# Patient Record
Sex: Female | Born: 1990 | Race: White | Hispanic: No | Marital: Single | Smoking: Never smoker
Health system: Southern US, Community
[De-identification: ages and names within clinical notes are randomized; demographics above are authoritative.]

## PROBLEM LIST (undated history)

## (undated) DIAGNOSIS — E079 Disorder of thyroid, unspecified: Secondary | ICD-10-CM

## (undated) DIAGNOSIS — I10 Essential (primary) hypertension: Secondary | ICD-10-CM

## (undated) DIAGNOSIS — E781 Pure hyperglyceridemia: Secondary | ICD-10-CM

## (undated) HISTORY — DX: Disorder of thyroid, unspecified: E07.9

## (undated) HISTORY — DX: Essential (primary) hypertension: I10

---
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
## 2016-07-29 DIAGNOSIS — L509 Urticaria, unspecified: Secondary | ICD-10-CM | POA: Diagnosis not present

## 2016-07-29 DIAGNOSIS — D229 Melanocytic nevi, unspecified: Secondary | ICD-10-CM | POA: Diagnosis not present

## 2016-11-13 DIAGNOSIS — R300 Dysuria: Secondary | ICD-10-CM | POA: Diagnosis not present

## 2016-11-13 DIAGNOSIS — N390 Urinary tract infection, site not specified: Secondary | ICD-10-CM | POA: Diagnosis not present

## 2016-11-22 DIAGNOSIS — T7840XA Allergy, unspecified, initial encounter: Secondary | ICD-10-CM | POA: Diagnosis not present

## 2016-11-22 DIAGNOSIS — R21 Rash and other nonspecific skin eruption: Secondary | ICD-10-CM | POA: Diagnosis not present

## 2017-10-01 ENCOUNTER — Encounter: Payer: Self-pay | Admitting: Obstetrics and Gynecology

## 2017-10-01 ENCOUNTER — Ambulatory Visit: Payer: BLUE CROSS/BLUE SHIELD | Admitting: Obstetrics and Gynecology

## 2017-10-01 ENCOUNTER — Other Ambulatory Visit: Payer: Self-pay

## 2017-10-01 ENCOUNTER — Other Ambulatory Visit (HOSPITAL_COMMUNITY)
Admission: RE | Admit: 2017-10-01 | Discharge: 2017-10-01 | Disposition: A | Discharge status: Home / Self Care | Payer: BLUE CROSS/BLUE SHIELD | Source: Ambulatory Visit | Attending: Obstetrics and Gynecology | Admitting: Obstetrics and Gynecology

## 2017-10-01 VITALS — BP 130/84 | HR 88 | Resp 20 | Ht 63.0 in | Wt 177.2 lb

## 2017-10-01 DIAGNOSIS — Z01419 Encounter for gynecological examination (general) (routine) without abnormal findings: Secondary | ICD-10-CM | POA: Diagnosis not present

## 2017-10-01 NOTE — Addendum Note (Signed)
Addended by: Ardell IsaacsAMUNDSON C SILVA, Orvan Papadakis E on: 10/01/2017 11:14 AM   Modules accepted: Level of Service

## 2017-10-01 NOTE — Progress Notes (Signed)
27 y.o. G0P0000 Single Caucasian female here for annual exam.    Menses not a problem for patient.   Not ever sexually active.   Mother had breast cancer in her 6340s.  Patient uncertain if she had genetic testing.   PCP:   None  Patient's last menstrual period was 09/06/2017 (exact date).     Period Cycle (Days): 30 Period Duration (Days): 6 days Period Pattern: Regular Menstrual Flow: Moderate Menstrual Control: Maxi pad Menstrual Control Change Freq (Hours): every 3-4 hours on heaviest day Dysmenorrhea: (!) Mild Dysmenorrhea Symptoms: Cramping     Sexually active: No.  The current method of family planning is abstinence.    Exercising: Yes.    walking. Smoker:  no  Health Maintenance: Pap:  NEVER History of abnormal Pap:  N/A MMG:  n/a Colonoscopy:  n/a BMD:   n/a  Result  n/a TDaP:  07/2011 Gardasil:   Yes, completed x3.   HIV:no Hep C:  no Screening Labs:   ---   reports that she has never smoked. She has never used smokeless tobacco. She reports that she drank alcohol. She reports that she does not use drugs.  History reviewed. No pertinent past medical history.  History reviewed. No pertinent surgical history.  No current outpatient medications on file.   No current facility-administered medications for this visit.     Family History  Problem Relation Age of Onset  . Breast cancer Mother [id number]       A & W  . Hypertension Father   . Hyperlipidemia Father   . Anxiety disorder Brother   . Diabetes Paternal Grandmother   . Hypertension Paternal Grandmother   . Stroke Paternal Grandmother   . Hypertension Paternal Grandfather     Review of Systems  Constitutional: Negative.   HENT: Negative.   Eyes: Negative.   Respiratory: Negative.   Cardiovascular: Negative.   Gastrointestinal: Negative.   Endocrine: Negative.   Genitourinary: Negative.   Musculoskeletal: Negative.   Skin: Negative.   Allergic/Immunologic: Negative.   Neurological: Negative.    Hematological: Negative.   Psychiatric/Behavioral: Negative.     Exam:   BP 130/84 (BP Location: Right Arm, Patient Position: Sitting, Cuff Size: Normal)   Pulse 88   Resp 20   Ht 5\' 3"  (1.6 m)   Wt 177 lb 3.2 oz (80.4 kg)   LMP 09/06/2017 (Exact Date)   BMI 31.39 kg/m     General appearance: alert, cooperative and appears stated age Head: Normocephalic, without obvious abnormality, atraumatic Neck: no adenopathy, supple, symmetrical, trachea midline and thyroid normal to inspection and palpation Lungs: clear to auscultation bilaterally Breasts: normal appearance, no masses or tenderness, No nipple retraction or dimpling, No nipple discharge or bleeding, No axillary or supraclavicular adenopathy Heart: regular rate and rhythm Abdomen: soft, non-tender; no masses, no organomegaly Extremities: extremities normal, atraumatic, no cyanosis or edema Skin: Skin color, texture, turgor normal. No rashes or lesions Lymph nodes: Cervical, supraclavicular, and axillary nodes normal. No abnormal inguinal nodes palpated Neurologic: Grossly normal  Pelvic: External genitalia:  no lesions              Urethra:  normal appearing urethra with no masses, tenderness or lesions              Bartholins and Skenes: normal                 Vagina: normal appearing vagina with normal color and discharge, no lesions  Cervix: no lesions              Pap taken: Yes.   Bimanual Exam:  Uterus:  normal size, contour, position, consistency, mobility, non-tender              Adnexa: no mass, fullness, tenderness       Chaperone was present for exam.  Assessment:   Well woman visit with normal exam. FH breast cancer.  Premenopausal.    Plan: Mammogram screening 10 years earlier than mother diagnosed. Recommended self breast awareness. Pap and HR HPV as above. Guidelines for Calcium, Vitamin D, regular exercise program including cardiovascular and weight bearing exercise. Routine labs.   Follow up annually and prn.    After visit summary provided.

## 2017-10-01 NOTE — Patient Instructions (Signed)

## 2017-10-02 LAB — COMPREHENSIVE METABOLIC PANEL
ALT: 14 IU/L (ref 0–32)
AST: 20 IU/L (ref 0–40)
Albumin/Globulin Ratio: 2 (ref 1.2–2.2)
Albumin: 4.9 g/dL (ref 3.5–5.5)
Alkaline Phosphatase: 60 IU/L (ref 39–117)
BUN/Creatinine Ratio: 19 (ref 9–23)
BUN: 15 mg/dL (ref 6–20)
Bilirubin Total: <0.2 mg/dL (ref 0.0–1.2)
CO2: 21 mmol/L (ref 20–29)
Calcium: 9.6 mg/dL (ref 8.7–10.2)
Chloride: 104 mmol/L (ref 96–106)
Creatinine, Ser: 0.79 mg/dL (ref 0.57–1.00)
GFR calc Af Amer: 121 mL/min/{1.73_m2} (ref >59)
GFR calc non Af Amer: 105 mL/min/{1.73_m2} (ref >59)
Globulin, Total: 2.5 g/dL (ref 1.5–4.5)
Glucose: 101 mg/dL — ABNORMAL HIGH (ref 65–99)
Potassium: 3.8 mmol/L (ref 3.5–5.2)
Sodium: 141 mmol/L (ref 134–144)
Total Protein: 7.4 g/dL (ref 6.0–8.5)

## 2017-10-02 LAB — CBC
Hematocrit: 40.4 % (ref 34.0–46.6)
Hemoglobin: 14 g/dL (ref 11.1–15.9)
MCH: 31.9 pg (ref 26.6–33.0)
MCHC: 34.7 g/dL (ref 31.5–35.7)
MCV: 92 fL (ref 79–97)
Platelets: 208 10*3/uL (ref 150–450)
RBC: 4.39 x10E6/uL (ref 3.77–5.28)
RDW: 13 % (ref 12.3–15.4)
WBC: 5.8 10*3/uL (ref 3.4–10.8)

## 2017-10-02 LAB — CYTOLOGY - PAP: Diagnosis: NEGATIVE

## 2017-10-02 LAB — LIPID PANEL
Chol/HDL Ratio: 3.2 ratio (ref 0.0–4.4)
Cholesterol, Total: 139 mg/dL (ref 100–199)
HDL: 44 mg/dL (ref >39)
LDL Calculated: 76 mg/dL (ref 0–99)
Triglycerides: 94 mg/dL (ref 0–149)
VLDL Cholesterol Cal: 19 mg/dL (ref 5–40)

## 2017-10-02 LAB — HM PAP SMEAR

## 2017-10-06 ENCOUNTER — Telehealth: Payer: Self-pay | Admitting: *Deleted

## 2017-10-06 DIAGNOSIS — L509 Urticaria, unspecified: Secondary | ICD-10-CM | POA: Diagnosis not present

## 2017-10-06 NOTE — Telephone Encounter (Signed)
-----   Message from Patton SallesBrook E Amundson C Silva, MD sent at 10/04/2017  5:49 PM EDT ----- Please report results to patient.   Her pap is normal.  Pap recall - 02.  Her blood counts and cholesterol were normal.  Her blood chemistries showed her glucose is just barely above normal. (I do not recall if she was fasting or not.) Nothing further needs to be done at this time.   I would just check her glucose yearly.

## 2017-10-06 NOTE — Telephone Encounter (Signed)
Call to patient. Left message to call back for triage nurse.   

## 2017-10-07 NOTE — Telephone Encounter (Signed)
Spoke with patient, advised as seen below per Dr. Edward JollySilva. Patient states she was not fasting prior to labs. Next AEX 10/28/18. Patient verbalizes understanding and is agreeable. Encounter closed.

## 2017-12-01 DIAGNOSIS — L509 Urticaria, unspecified: Secondary | ICD-10-CM | POA: Diagnosis not present

## 2018-04-04 DIAGNOSIS — H6691 Otitis media, unspecified, right ear: Secondary | ICD-10-CM | POA: Diagnosis not present

## 2018-04-08 DIAGNOSIS — E781 Pure hyperglyceridemia: Secondary | ICD-10-CM

## 2018-04-08 HISTORY — DX: Pure hyperglyceridemia: E78.1

## 2018-10-28 ENCOUNTER — Encounter: Payer: Self-pay | Admitting: Obstetrics and Gynecology

## 2018-10-28 ENCOUNTER — Other Ambulatory Visit: Payer: Self-pay

## 2018-10-28 ENCOUNTER — Ambulatory Visit (INDEPENDENT_AMBULATORY_CARE_PROVIDER_SITE_OTHER): Payer: BC Managed Care – PPO | Admitting: Obstetrics and Gynecology

## 2018-10-28 VITALS — BP 142/90 | HR 104 | Temp 98.1°F | Resp 14 | Ht 62.75 in | Wt 188.0 lb

## 2018-10-28 DIAGNOSIS — Z01419 Encounter for gynecological examination (general) (routine) without abnormal findings: Secondary | ICD-10-CM

## 2018-10-28 DIAGNOSIS — L83 Acanthosis nigricans: Secondary | ICD-10-CM | POA: Diagnosis not present

## 2018-10-28 NOTE — Patient Instructions (Signed)

## 2018-10-28 NOTE — Progress Notes (Signed)
28 y.o. G53P0000 Single Caucasian female here for annual exam.    Notes dark skin of her underarms.  Noticing black oily liquid in the shower when she washes her hair.  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   Menses regular.  No heavy bleeding or cramping.   Not sexually active.  Declines birth control.  Working during the pandemic.  PCP: No PCP    Patient's last menstrual period was 10/02/2018.           Sexually active: No.  The current method of family planning is abstinence.    Exercising: Yes.    walking on treadmill Smoker:  no  Health Maintenance: Pap:  10/01/17 Negative History of abnormal Pap:  no TDaP:  2013 Gardasil:   yes HIV: never Hep C: never Screening Labs:  Discuss if needed   reports that she has never smoked. She has never used smokeless tobacco. She reports current alcohol use. She reports that she does not use drugs.  History reviewed. No pertinent past medical history.  History reviewed. No pertinent surgical history.  Current Outpatient Medications  Medication Sig Dispense Refill  . levocetirizine (XYZAL) 5 MG tablet Take 5 mg by mouth every evening.    . loratadine (CLARITIN) 10 MG tablet Take 10 mg by mouth daily.     No current facility-administered medications for this visit.     Family History  Problem Relation Age of Onset  . Breast cancer Mother 64       A & W  . Hypertension Father   . Hyperlipidemia Father   . Anxiety disorder Brother   . Diabetes Paternal Grandmother   . Hypertension Paternal Grandmother   . Stroke Paternal Grandmother   . Hypertension Paternal Grandfather     Review of Systems  Constitutional: Negative.   HENT: Negative.   Eyes: Negative.   Respiratory: Negative.   Cardiovascular: Negative.   Gastrointestinal: Negative.   Endocrine: Negative.   Genitourinary: Negative.   Musculoskeletal: Negative.   Skin: Negative.   Allergic/Immunologic: Negative.   Neurological: Negative.   Hematological: Negative.    Psychiatric/Behavioral: Negative.     Exam:   BP (!) 142/90 (BP Location: Right Arm, Patient Position: Sitting, Cuff Size: Normal)   Pulse (!) 104   Temp 98.1 F (36.7 C) (Temporal)   Resp 14   Ht 5' 2.75" (1.594 m)   Wt 188 lb (85.3 kg)   LMP 10/02/2018   BMI 33.57 kg/m     General appearance: alert, cooperative and appears stated age Head: normocephalic, without obvious abnormality, atraumatic Neck: no adenopathy, supple, symmetrical, trachea midline and thyroid normal to inspection and palpation Lungs: clear to auscultation bilaterally Breasts: normal appearance, no masses or tenderness, No nipple retraction or dimpling, No nipple discharge or bleeding, No axillary adenopathy Heart: regular rate and rhythm Abdomen: soft, non-tender; no masses, no organomegaly Extremities: extremities normal, atraumatic, no cyanosis or edema Skin: skin color, texture, turgor normal. Acanthosis nigricans of the axillary regions and the medial thighs. Lymph nodes: cervical, supraclavicular, and axillary nodes normal. Neurologic: grossly normal  Pelvic: External genitalia:  no lesions              No abnormal inguinal nodes palpated.              Urethra:  normal appearing urethra with no masses, tenderness or lesions              Bartholins and Skenes: normal  Vagina: normal appearing vagina with normal color and discharge, no lesions              Cervix: no lesions              Pap taken: No. Bimanual Exam:  Uterus:  normal size, contour, position, consistency, mobility, non-tender              Adnexa: no mass, fullness, tenderness               Chaperone was present for exam.  Assessment:   Well woman visit with normal exam. FH breast cancer in mother - premenopausal.  Elevated BP today.  Skin coloration changes consistent with acanthosis nigricans.   Plan: Mammogram screening discussed. Self breast awareness reviewed. Pap and HR HPV as above. Guidelines for  Calcium, Vitamin D, regular exercise program including cardiovascular and weight bearing exercise. She will monitor her blood pressure at home. Routine labs including A1C. She will follow up with her dermatologist. Follow up annually and prn.   After visit summary provided.

## 2018-10-29 LAB — COMPREHENSIVE METABOLIC PANEL
ALT: 15 IU/L (ref 0–32)
AST: 16 IU/L (ref 0–40)
Albumin/Globulin Ratio: 2.4 — ABNORMAL HIGH (ref 1.2–2.2)
Albumin: 5 g/dL (ref 3.9–5.0)
Alkaline Phosphatase: 71 IU/L (ref 39–117)
BUN/Creatinine Ratio: 14 (ref 9–23)
BUN: 12 mg/dL (ref 6–20)
Bilirubin Total: 0.3 mg/dL (ref 0.0–1.2)
CO2: 24 mmol/L (ref 20–29)
Calcium: 9.3 mg/dL (ref 8.7–10.2)
Chloride: 101 mmol/L (ref 96–106)
Creatinine, Ser: 0.86 mg/dL (ref 0.57–1.00)
GFR calc Af Amer: 109 mL/min/{1.73_m2} (ref >59)
GFR calc non Af Amer: 94 mL/min/{1.73_m2} (ref >59)
Globulin, Total: 2.1 g/dL (ref 1.5–4.5)
Glucose: 88 mg/dL (ref 65–99)
Potassium: 3.7 mmol/L (ref 3.5–5.2)
Sodium: 140 mmol/L (ref 134–144)
Total Protein: 7.1 g/dL (ref 6.0–8.5)

## 2018-10-29 LAB — CBC
Hematocrit: 43.3 % (ref 34.0–46.6)
Hemoglobin: 14.2 g/dL (ref 11.1–15.9)
MCH: 31.3 pg (ref 26.6–33.0)
MCHC: 32.8 g/dL (ref 31.5–35.7)
MCV: 96 fL (ref 79–97)
Platelets: 225 10*3/uL (ref 150–450)
RBC: 4.53 x10E6/uL (ref 3.77–5.28)
RDW: 13.2 % (ref 11.7–15.4)
WBC: 7.9 10*3/uL (ref 3.4–10.8)

## 2018-10-29 LAB — LIPID PANEL
Chol/HDL Ratio: 4 ratio (ref 0.0–4.4)
Cholesterol, Total: 133 mg/dL (ref 100–199)
HDL: 33 mg/dL — ABNORMAL LOW (ref >39)
LDL Calculated: 55 mg/dL (ref 0–99)
Triglycerides: 223 mg/dL — ABNORMAL HIGH (ref 0–149)
VLDL Cholesterol Cal: 45 mg/dL — ABNORMAL HIGH (ref 5–40)

## 2018-10-29 LAB — HEMOGLOBIN A1C
Est. average glucose Bld gHb Est-mCnc: 91 mg/dL
Hgb A1c MFr Bld: 4.8 % (ref 4.8–5.6)

## 2018-11-01 ENCOUNTER — Encounter: Payer: Self-pay | Admitting: Obstetrics and Gynecology

## 2019-07-01 ENCOUNTER — Ambulatory Visit (INDEPENDENT_AMBULATORY_CARE_PROVIDER_SITE_OTHER): Payer: No Typology Code available for payment source | Admitting: Physician Assistant

## 2019-07-01 ENCOUNTER — Encounter: Payer: Self-pay | Admitting: Physician Assistant

## 2019-07-01 ENCOUNTER — Other Ambulatory Visit: Payer: Self-pay

## 2019-07-01 DIAGNOSIS — L814 Other melanin hyperpigmentation: Secondary | ICD-10-CM

## 2019-07-01 MED ORDER — DESONIDE 0.05 % EX CREA: TOPICAL_CREAM | Freq: Two times a day (BID) | CUTANEOUS | 2 refills | Status: DC

## 2019-07-01 NOTE — Progress Notes (Signed)
   Follow up Visit  Subjective  Anita Foster is a 29 y.o. female who presents for the following: Skin Problem (Patient here for discoloration under the arms. patient used porcalana and it helped some but she got irritated. 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 Her dermatographism is bothering her at dinner time. She takes two Claritin in the morning and zyrtec at night.  Objective  Well appearing patient in no apparent distress; mood and affect are within normal limits.  A focused examination was performed including axillae. Relevant physical exam findings are noted in the Assessment and Plan.   Objective  Left Axilla, Right Axilla: Hyperpigmentation noted axilla.  Assessment & Plan  Other melanin hyperpigmentation (2) Left Axilla; Right Axilla  Niacinamide 4mg  OTC qhs mixed with Desonide.  desonide (DESOWEN) 0.05 % cream - Left Axilla, Right Axilla  We discussed her dermatographism which isn't active currently. We discussed her adding a Claritin at noon.

## 2019-08-02 ENCOUNTER — Telehealth: Payer: Self-pay | Admitting: Obstetrics and Gynecology

## 2019-08-02 NOTE — Telephone Encounter (Signed)
Spoke with patient. Patient reports menses started on 07/23/19, has not stopped, still spotting. Describes blood as light pink to brown. Denies pain, fever/chill N/V, vaginal odor, itching or urinary symptoms. Patient is not SA, abstinence for contraceptive. Patient reports menses have always been regular.   Last AEX 10/28/18. OV scheduled for 5/3 at 4:30pm with Dr. Edward Jolly. Advised patient to continue to monitor menses. Return call to office for earlier OV if new symptoms develop or if bleeding becomes heavy, changing a pa/tampon q1-2 hrs. Advised if any chance of pregnancy, take UPT. Dr. Edward Jolly will review, our office will return call if any additional recommendations. Patient verbalizes understanding and is agreeable.   Routing to provider for final review. Patient is agreeable to disposition. Will close encounter.

## 2019-08-02 NOTE — Telephone Encounter (Signed)
Patient has been spotting for 10 days.

## 2019-08-09 ENCOUNTER — Other Ambulatory Visit: Payer: Self-pay

## 2019-08-09 ENCOUNTER — Ambulatory Visit (INDEPENDENT_AMBULATORY_CARE_PROVIDER_SITE_OTHER): Payer: No Typology Code available for payment source | Admitting: Obstetrics and Gynecology

## 2019-08-09 ENCOUNTER — Encounter: Payer: Self-pay | Admitting: Obstetrics and Gynecology

## 2019-08-09 VITALS — BP 138/98 | HR 96 | Temp 97.4°F | Ht 63.0 in | Wt 198.0 lb

## 2019-08-09 DIAGNOSIS — R030 Elevated blood-pressure reading, without diagnosis of hypertension: Secondary | ICD-10-CM

## 2019-08-09 DIAGNOSIS — N926 Irregular menstruation, unspecified: Secondary | ICD-10-CM | POA: Diagnosis not present

## 2019-08-09 LAB — POCT URINE PREGNANCY: Preg Test, Ur: NEGATIVE

## 2019-08-09 NOTE — Patient Instructions (Signed)
Abnormal Uterine Bleeding °Abnormal uterine bleeding means bleeding more than usual from your uterus. It can include: °· Bleeding between periods. °· Bleeding after sex. °· Bleeding that is heavier than normal. °· Periods that last longer than usual. °· Bleeding after you have stopped having your period (menopause). °There are many problems that may cause this. You should see a doctor for any kind of bleeding that is not normal. Treatment depends on the cause of the bleeding. °Follow these instructions at home: °· Watch your condition for any changes. °· Do not use tampons, douche, or have sex, if your doctor tells you not to. °· Change your pads often. °· Get regular well-woman exams. Make sure they include a pelvic exam and cervical cancer screening. °· Keep all follow-up visits as told by your doctor. This is important. °Contact a doctor if: °· The bleeding lasts more than one week. °· You feel dizzy at times. °· You feel like you are going to throw up (nauseous). °· You throw up. °Get help right away if: °· You pass out. °· You have to change pads every hour. °· You have belly (abdominal) pain. °· You have a fever. °· You get sweaty. °· You get weak. °· You passing large blood clots from your vagina. °Summary °· Abnormal uterine bleeding means bleeding more than usual from your uterus. °· There are many problems that may cause this. You should see a doctor for any kind of bleeding that is not normal. °· Treatment depends on the cause of the bleeding. °This information is not intended to replace advice given to you by your health care provider. Make sure you discuss any questions you have with your health care provider. °Document Revised: 03/19/2016 Document Reviewed: 03/19/2016 °Elsevier Patient Education © 2020 [employer] ° °

## 2019-08-09 NOTE — Progress Notes (Signed)
GYNECOLOGY  VISIT   HPI: 29 y.o.   Single  Caucasian  female   G0P0000 with Patient's last menstrual period was 07/23/2019.   here for irregular menses. Patient states she has had spotting x2 weeks (4/16 - 4/30)  instead of having a regular period.    No pain.   Her menses prior to this was mid March.  Usually has a 28 day cycle.   Not sexually active ever.   No new medical problems.   Walking at work.  Doing treadmill on weekends.   UPT negative.  (Done prior to having full sexual history.)  GYNECOLOGIC HISTORY: Patient's last menstrual period was 07/23/2019. Contraception:  Abstinence Menopausal hormone therapy:  n/a Last mammogram:  n/a Last pap smear:   10/01/17 Negative        OB History    Gravida  0   Para  0   Term  0   Preterm  0   AB  0   Living  0     SAB  0   TAB  0   Ectopic  0   Multiple  0   Live Births  0              There are no problems to display for this patient.   Past Medical History:  Diagnosis Date  . Hypertriglyceridemia 2020    No past surgical history on file.  Current Outpatient Medications  Medication Sig Dispense Refill  . cetirizine (ZYRTEC) 10 MG chewable tablet Chew 10 mg by mouth daily.    Marland Kitchen desonide (DESOWEN) 0.05 % cream Apply topically 2 (two) times daily. 60 g 2  . loratadine (CLARITIN) 10 MG tablet Take 10 mg by mouth daily.     No current facility-administered medications for this visit.     ALLERGIES: Sulfa antibiotics  Family History  Problem Relation Age of Onset  . Breast cancer Mother 35       A & W  . Hypertension Father   . Hyperlipidemia Father   . Anxiety disorder Brother   . Diabetes Paternal Grandmother   . Hypertension Paternal Grandmother   . Stroke Paternal Grandmother   . Hypertension Paternal Grandfather     Social History   Socioeconomic History  . Marital status: Single    Spouse name: Not on file  . Number of children: Not on file  . Years of education: Not on  file  . Highest education level: Not on file  Occupational History  . Not on file  Tobacco Use  . Smoking status: Never Smoker  . Smokeless tobacco: Never Used  Substance and Sexual Activity  . Alcohol use: Yes    Comment: rarely  . Drug use: Never  . Sexual activity: Never    Birth control/protection: Abstinence  Other Topics Concern  . Not on file  Social History Narrative  . Not on file   Social Determinants of Health   Financial Resource Strain:   . Difficulty of Paying Living Expenses:   Food Insecurity:   . Worried About Programme researcher, broadcasting/film/video in the Last Year:   . Barista in the Last Year:   Transportation Needs:   . Freight forwarder (Medical):   Marland Kitchen Lack of Transportation (Non-Medical):   Physical Activity:   . Days of Exercise per Week:   . Minutes of Exercise per Session:   Stress:   . Feeling of Stress :   Social Connections:   .  Frequency of Communication with Friends and Family:   . Frequency of Social Gatherings with Friends and Family:   . Attends Religious Services:   . Active Member of Clubs or Organizations:   . Attends Archivist Meetings:   Marland Kitchen Marital Status:   Intimate Partner Violence:   . Fear of Current or Ex-Partner:   . Emotionally Abused:   Marland Kitchen Physically Abused:   . Sexually Abused:     Review of Systems  Constitutional: Negative.   HENT: Negative.   Eyes: Negative.   Respiratory: Negative.   Cardiovascular: Negative.   Gastrointestinal: Negative.   Endocrine: Negative.   Genitourinary:       Irregular bleeding  Musculoskeletal: Negative.   Skin: Negative.   Allergic/Immunologic: Negative.   Neurological: Negative.   Hematological: Negative.   Psychiatric/Behavioral: Negative.     PHYSICAL EXAMINATION:    BP (!) 138/98 (BP Location: Right Arm, Patient Position: Sitting, Cuff Size: Large)   Pulse 96   Temp (!) 97.4 F (36.3 C) (Temporal)   Ht 5\' 3"  (1.6 m)   Wt 198 lb (89.8 kg)   LMP 07/23/2019   BMI  35.07 kg/m     General appearance: alert, cooperative and appears stated age  Pelvic: External genitalia:  no lesions              Urethra:  normal appearing urethra with no masses, tenderness or lesions              Bartholins and Skenes: normal                 Vagina: normal appearing vagina with normal color and discharge, no lesions              Cervix: no lesions                Bimanual Exam:  Uterus:  normal size, contour, position, consistency, mobility, non-tender              Adnexa: no mass, fullness, tenderness                Chaperone was present for exam.  ASSESSMENT  Abnormal menses.  Probable anovulatory cycle. Weight gain.  Elevated blood pressure reading.   PLAN  We talked about potential reasons for an abnormal menstruation. She will keep a bleeding calendar and return if the irregularity continues.  We reviewed hormonal contraception as a method of pregnancy prevention and menstrual cycle regulation. I encouraged weight loss.    An After Visit Summary was printed and given to the patient.  _20_____ minutes face to face time of which over 50% was spent in counseling.   Addendum done on 08/11/19. Chart review of blood pressure.  Patient has had increasing blood pressure readings over her last 3 visits.  Will have her establish care with a PCP.

## 2019-08-11 ENCOUNTER — Telehealth: Payer: Self-pay | Admitting: Obstetrics and Gynecology

## 2019-08-11 DIAGNOSIS — R030 Elevated blood-pressure reading, without diagnosis of hypertension: Secondary | ICD-10-CM | POA: Insufficient documentation

## 2019-08-11 NOTE — Telephone Encounter (Signed)
Please contact patient in follow up to her visit this week.   I did a chart review, and I noted that her blood pressure has been increasing steadily over the last 3 visits.   The weight loss we discussed should help reduce her blood pressure but she may need more than that to control it.   I recommend she see a PCP for further evaluation.  We can make a referral for her.  I would recommend a [facility] provider as they can follow her readings in her Epic encounters.

## 2019-08-11 NOTE — Telephone Encounter (Signed)
Left message to call Laycee Fitzsimmons, RN at [facility] [phone].   

## 2019-08-18 NOTE — Telephone Encounter (Signed)
Spoke with patient, advised per Dr. Edward Jolly. Patient does not have a PCP, reviewed options for providers in her area. Patient declines assistance with scheduling. Patient states she would like to speak with family members to see where they are established, will plan to schedule after that. Advised patient to return call to office if any assistance is needed. Patient verbalizes understanding and is agreeable.   Routing to provider for final review. Patient is agreeable to disposition. Will close encounter.

## 2019-08-18 NOTE — Telephone Encounter (Signed)
Call placed to home number, was advised patient not available.   Call placed to mobile number, Left message to call Noreene Larsson, RN at [facility] [phone].

## 2020-03-20 ENCOUNTER — Encounter: Payer: Self-pay | Admitting: Internal Medicine

## 2020-03-20 ENCOUNTER — Ambulatory Visit (INDEPENDENT_AMBULATORY_CARE_PROVIDER_SITE_OTHER): Payer: No Typology Code available for payment source | Admitting: Internal Medicine

## 2020-03-20 ENCOUNTER — Other Ambulatory Visit: Payer: Self-pay

## 2020-03-20 VITALS — BP 170/114 | HR 100 | Temp 98.8°F | Resp 16 | Ht 63.0 in | Wt 202.0 lb

## 2020-03-20 DIAGNOSIS — E559 Vitamin D deficiency, unspecified: Secondary | ICD-10-CM

## 2020-03-20 DIAGNOSIS — G4452 New daily persistent headache (NDPH): Secondary | ICD-10-CM | POA: Diagnosis not present

## 2020-03-20 DIAGNOSIS — E039 Hypothyroidism, unspecified: Secondary | ICD-10-CM

## 2020-03-20 DIAGNOSIS — Z0000 Encounter for general adult medical examination without abnormal findings: Secondary | ICD-10-CM

## 2020-03-20 DIAGNOSIS — I10 Essential (primary) hypertension: Secondary | ICD-10-CM | POA: Diagnosis not present

## 2020-03-20 DIAGNOSIS — Z0001 Encounter for general adult medical examination with abnormal findings: Secondary | ICD-10-CM | POA: Insufficient documentation

## 2020-03-20 DIAGNOSIS — Z1159 Encounter for screening for other viral diseases: Secondary | ICD-10-CM | POA: Insufficient documentation

## 2020-03-20 LAB — LIPID PANEL
Cholesterol: 145 mg/dL (ref 0–200)
HDL: 37.6 mg/dL — ABNORMAL LOW (ref >39.00)
LDL Cholesterol: 68 mg/dL (ref 0–99)
NonHDL: 107.42
Total CHOL/HDL Ratio: 4
Triglycerides: 198 mg/dL — ABNORMAL HIGH (ref 0.0–149.0)
VLDL: 39.6 mg/dL (ref 0.0–40.0)

## 2020-03-20 LAB — CBC WITH DIFFERENTIAL/PLATELET
Basophils Absolute: 0 10*3/uL (ref 0.0–0.1)
Basophils Relative: 0.2 % (ref 0.0–3.0)
Eosinophils Absolute: 0.1 10*3/uL (ref 0.0–0.7)
Eosinophils Relative: 1.7 % (ref 0.0–5.0)
HCT: 41.4 % (ref 36.0–46.0)
Hemoglobin: 14.2 g/dL (ref 12.0–15.0)
Lymphocytes Relative: 19.8 % (ref 12.0–46.0)
Lymphs Abs: 1.3 10*3/uL (ref 0.7–4.0)
MCHC: 34.2 g/dL (ref 30.0–36.0)
MCV: 88.5 fl (ref 78.0–100.0)
Monocytes Absolute: 0.3 10*3/uL (ref 0.1–1.0)
Monocytes Relative: 5 % (ref 3.0–12.0)
Neutro Abs: 4.8 10*3/uL (ref 1.4–7.7)
Neutrophils Relative %: 73.3 % (ref 43.0–77.0)
Platelets: 240 10*3/uL (ref 150.0–400.0)
RBC: 4.68 Mil/uL (ref 3.87–5.11)
RDW: 13.1 % (ref 11.5–15.5)
WBC: 6.5 10*3/uL (ref 4.0–10.5)

## 2020-03-20 LAB — URINALYSIS, ROUTINE W REFLEX MICROSCOPIC
Bilirubin Urine: NEGATIVE
Hgb urine dipstick: NEGATIVE
Ketones, ur: NEGATIVE
Leukocytes,Ua: NEGATIVE
Nitrite: NEGATIVE
RBC / HPF: NONE SEEN (ref 0–?)
Specific Gravity, Urine: 1.01 (ref 1.000–1.030)
Total Protein, Urine: NEGATIVE
Urine Glucose: NEGATIVE
Urobilinogen, UA: 0.2 (ref 0.0–1.0)
pH: 6 (ref 5.0–8.0)

## 2020-03-20 LAB — BASIC METABOLIC PANEL
BUN: 13 mg/dL (ref 6–23)
CO2: 29 mEq/L (ref 19–32)
Calcium: 10 mg/dL (ref 8.4–10.5)
Chloride: 102 mEq/L (ref 96–112)
Creatinine, Ser: 0.96 mg/dL (ref 0.40–1.20)
GFR: 81.15 mL/min (ref >60.00)
Glucose, Bld: 94 mg/dL (ref 70–99)
Potassium: 4.3 mEq/L (ref 3.5–5.1)
Sodium: 140 mEq/L (ref 135–145)

## 2020-03-20 LAB — HEPATIC FUNCTION PANEL
ALT: 19 U/L (ref 0–35)
AST: 18 U/L (ref 0–37)
Albumin: 4.8 g/dL (ref 3.5–5.2)
Alkaline Phosphatase: 68 U/L (ref 39–117)
Bilirubin, Direct: 0.1 mg/dL (ref 0.0–0.3)
Total Bilirubin: 0.4 mg/dL (ref 0.2–1.2)
Total Protein: 7.7 g/dL (ref 6.0–8.3)

## 2020-03-20 LAB — VITAMIN D 25 HYDROXY (VIT D DEFICIENCY, FRACTURES): VITD: 20.01 ng/mL — ABNORMAL LOW (ref 30.00–100.00)

## 2020-03-20 LAB — TSH: TSH: 5.55 u[IU]/mL — ABNORMAL HIGH (ref 0.35–4.50)

## 2020-03-20 MED ORDER — LEVOTHYROXINE SODIUM 25 MCG PO TABS
25.0000 ug | ORAL_TABLET | Freq: Every day | ORAL | 0 refills | Status: DC
Start: 2020-03-20 — End: 2020-05-04

## 2020-03-20 MED ORDER — CHOLECALCIFEROL 1.25 MG (50000 UT) PO CAPS: 50000.0000 [IU] | ORAL_CAPSULE | ORAL | 0 refills | Status: DC

## 2020-03-20 MED ORDER — NEBIVOLOL HCL 5 MG PO TABS: 5.0000 mg | ORAL_TABLET | Freq: Every day | ORAL | 0 refills | Status: DC

## 2020-03-20 MED ORDER — INDAPAMIDE 1.25 MG PO TABS: 1.2500 mg | ORAL_TABLET | Freq: Every day | ORAL | 0 refills | Status: DC

## 2020-03-20 NOTE — Progress Notes (Signed)
Subjective:  Patient ID: Anita Foster, female    DOB: 1990/11/27  Age: 29 y.o. MRN: [id number]  CC: Annual Exam and Hypertension  This visit occurred during the SARS-CoV-2 public health emergency.  Safety protocols were in place, including screening questions prior to the visit, additional usage of staff PPE, and extensive cleaning of exam room while observing appropriate contact time as indicated for disinfecting solutions.    HPI Anita Foster presents for a CPX.  She has a history of hypertension.  She is not currently taking an antihypertensive and admits she has not been working on her lifestyle modifications.  She has had a headache on the left top and parietal aspect of her head for about 3 weeks.  She denies blurred vision, slurred speech, ataxia, paresthesias, nausea, or vomiting.  She has not taken Advil in over a week.  She does not take decongestants.  She does not have a history of substance abuse.  She rarely drinks alcohol.  History Anita Foster has a past medical history of Hypertriglyceridemia (2020).   She has no past surgical history on file.   Her family history includes Anxiety disorder in her brother; Breast cancer (age of onset: 76) in her mother; Diabetes in her paternal grandmother; Hyperlipidemia in her father; Hypertension in her father, paternal grandfather, and paternal grandmother; Stroke in her paternal grandmother.She reports that she has never smoked. She has never used smokeless tobacco. She reports current alcohol use of about 1.0 standard drink of alcohol per week. She reports that she does not use drugs.  Outpatient Medications Prior to Visit  Medication Sig Dispense Refill  . cetirizine (ZYRTEC) 10 MG chewable tablet Chew 10 mg by mouth daily.    Marland Kitchen desonide (DESOWEN) 0.05 % cream Apply topically 2 (two) times daily. 60 g 2  . loratadine (CLARITIN) 10 MG tablet Take 10 mg by mouth daily.     No facility-administered medications prior to visit.     ROS Review of Systems  Constitutional: Positive for unexpected weight change (wt gain). Negative for appetite change, chills, diaphoresis and fatigue.  HENT: Negative.   Eyes: Negative for visual disturbance.  Respiratory: Negative for apnea, cough, choking, shortness of breath and wheezing.        No snoring  Cardiovascular: Negative for chest pain, palpitations and leg swelling.  Gastrointestinal: Negative for abdominal pain, constipation, diarrhea, nausea and vomiting.  Endocrine: Negative.   Genitourinary: Negative.  Negative for difficulty urinating and dysuria.  Musculoskeletal: Negative.  Negative for back pain, myalgias and neck pain.  Skin: Negative.   Neurological: Positive for headaches. Negative for dizziness, speech difficulty, weakness and numbness.  Hematological: Negative for adenopathy. Does not bruise/bleed easily.  Psychiatric/Behavioral: Negative.     Objective:  BP (!) 170/114 Comment: BP (R) 170/114 (L) 166/110  Pulse 100   Temp 98.8 F (37.1 C) (Oral)   Resp 16   Ht 5\' 3"  (1.6 m)   Wt 202 lb (91.6 kg)   LMP 03/08/2020   SpO2 98%   BMI 35.78 kg/m   Physical Exam Vitals reviewed.  Constitutional:      Appearance: She is obese.  HENT:     Nose: Nose normal.     Mouth/Throat:     Mouth: Mucous membranes are moist.  Eyes:     General: No scleral icterus.    Extraocular Movements: Extraocular movements intact.     Pupils: Pupils are equal, round, and reactive to light.  Cardiovascular:  Rate and Rhythm: Regular rhythm. Tachycardia present.     Heart sounds: No murmur heard.     Comments: EKG- Sinus tachycardia, 103 bpm TWI in III No LVH Pulmonary:     Effort: Pulmonary effort is normal.     Breath sounds: No stridor. No wheezing, rhonchi or rales.  Abdominal:     General: Abdomen is protuberant. Bowel sounds are normal. There is no distension.     Palpations: Abdomen is soft. There is no hepatomegaly, splenomegaly or mass.      Tenderness: There is no abdominal tenderness.     Hernia: No hernia is present.  Musculoskeletal:        General: Normal range of motion.     Cervical back: Neck supple.     Right lower leg: No edema.     Left lower leg: No edema.  Lymphadenopathy:     Cervical: No cervical adenopathy.  Skin:    General: Skin is warm and dry.     Coloration: Skin is not pale.  Neurological:     General: No focal deficit present.     Mental Status: She is alert and oriented to person, place, and time. Mental status is at baseline.     Cranial Nerves: No cranial nerve deficit.     Sensory: No sensory deficit.     Motor: No weakness.     Coordination: Coordination normal.     Gait: Gait normal.     Deep Tendon Reflexes: Reflexes normal.  Psychiatric:        Mood and Affect: Mood normal.        Behavior: Behavior normal.     Lab Results  Component Value Date   WBC 6.5 03/20/2020   HGB 14.2 03/20/2020   HCT 41.4 03/20/2020   PLT 240.0 03/20/2020   GLUCOSE 94 03/20/2020   CHOL 145 03/20/2020   TRIG 198.0 (H) 03/20/2020   HDL 37.60 (L) 03/20/2020   LDLCALC 68 03/20/2020   ALT 19 03/20/2020   AST 18 03/20/2020   NA 140 03/20/2020   K 4.3 03/20/2020   CL 102 03/20/2020   CREATININE 0.96 03/20/2020   BUN 13 03/20/2020   CO2 29 03/20/2020   TSH 5.55 (H) 03/20/2020   HGBA1C 4.8 10/28/2018    Assessment & Plan:   Anita Foster was seen today for annual exam and hypertension.  Diagnoses and all orders for this visit:  Malignant hypertension- She has malignant hypertension and is symptomatic.  I will check labs to screen for secondary causes of hypertension.  Her EKG is negative for LVH or ischemia.  I will treat this with nebivolol and indapamide. -     CBC with Differential/Platelet; Future -     Aldosterone + renin activity w/ ratio; Future -     Metanephrines, plasma; Future -     TSH; Future -     Urinalysis, Routine w reflex microscopic; Future -     VITAMIN D 25 Hydroxy (Vit-D  Deficiency, Fractures); Future -     Hepatic function panel; Future -     Basic metabolic panel; Future -     EKG 12-Lead -     Basic metabolic panel -     Hepatic function panel -     VITAMIN D 25 Hydroxy (Vit-D Deficiency, Fractures) -     Urinalysis, Routine w reflex microscopic -     TSH -     Metanephrines, plasma -     Aldosterone + renin activity  w/ ratio -     CBC with Differential/Platelet -     nebivolol (BYSTOLIC) 5 MG tablet; Take 1 tablet (5 mg total) by mouth daily. -     indapamide (LOZOL) 1.25 MG tablet; Take 1 tablet (1.25 mg total) by mouth daily.  Encounter for general adult medical examination with abnormal findings- Exam exam completed, labs reviewed, vaccines reviewed - She refused a flu vaccine, cancer screenings are up-to-date, patient ed was given. -     Lipid panel; Future -     Hepatitis C antibody; Future -     Hepatitis C antibody -     Lipid panel  Need for hepatitis C screening test -     HIV Antibody (routine testing w rflx); Future -     HIV Antibody (routine testing w rflx)  Acquired hypothyroidism -     levothyroxine (SYNTHROID) 25 MCG tablet; Take 1 tablet (25 mcg total) by mouth daily before breakfast.  New daily persistent headache- She has a headache and severely elevated blood pressure.  I recommend that she undergo an MRI to screen for hemorrhage, mass, CVA,NPH, or tumor. -     MR Brain Wo Contrast; Future  Vitamin D deficiency disease -     Cholecalciferol 1.25 MG (50000 UT) capsule; Take 1 capsule (50,000 Units total) by mouth once a week.   I have discontinued Anita Foster's loratadine and desonide. I am also having her start on nebivolol, indapamide, levothyroxine, and Cholecalciferol. Additionally, I am having her maintain her cetirizine.  Meds ordered this encounter  Medications  . nebivolol (BYSTOLIC) 5 MG tablet    Sig: Take 1 tablet (5 mg total) by mouth daily.    Dispense:  90 tablet    Refill:  0  . indapamide (LOZOL)  1.25 MG tablet    Sig: Take 1 tablet (1.25 mg total) by mouth daily.    Dispense:  90 tablet    Refill:  0  . levothyroxine (SYNTHROID) 25 MCG tablet    Sig: Take 1 tablet (25 mcg total) by mouth daily before breakfast.    Dispense:  90 tablet    Refill:  0  . Cholecalciferol 1.25 MG (50000 UT) capsule    Sig: Take 1 capsule (50,000 Units total) by mouth once a week.    Dispense:  12 capsule    Refill:  0   In addition to time spent on CPE, I spent 50 minutes in preparing to see the patient by review of recent labs, imaging and procedures, obtaining and reviewing separately obtained history, communicating with the patient and family or caregiver, ordering medications, tests or procedures, and documenting clinical information in the EHR including the differential Dx, treatment, and any further evaluation and other management of 1. Malignant hypertension 2. Acquired hypothyroidism 3. New daily persistent headache 4. Vitamin D deficiency disease     Follow-up: Return in about 6 weeks (around 05/01/2020).  Sanda Linger, MD

## 2020-03-20 NOTE — Patient Instructions (Signed)

## 2020-03-25 LAB — HEPATITIS C ANTIBODY
Hepatitis C Ab: NONREACTIVE
SIGNAL TO CUT-OFF: 0.01 (ref <1.00)

## 2020-03-25 LAB — ALDOSTERONE + RENIN ACTIVITY W/ RATIO
ALDO / PRA Ratio: 3.3 Ratio (ref 0.9–28.9)
Aldosterone: 10 ng/dL
Renin Activity: 3.06 ng/mL/h (ref 0.25–5.82)

## 2020-03-25 LAB — METANEPHRINES, PLASMA
Metanephrine, Free: 42 pg/mL (ref <=57)
Normetanephrine, Free: 67 pg/mL (ref <=148)
Total Metanephrines-Plasma: 109 pg/mL (ref <=205)

## 2020-03-25 LAB — HIV ANTIBODY (ROUTINE TESTING W REFLEX): HIV 1&2 Ab, 4th Generation: NONREACTIVE

## 2020-04-15 ENCOUNTER — Ambulatory Visit
Admission: RE | Admit: 2020-04-15 | Discharge: 2020-04-15 | Disposition: A | Discharge status: Home / Self Care | Payer: No Typology Code available for payment source | Source: Ambulatory Visit | Attending: Internal Medicine | Admitting: Internal Medicine

## 2020-04-15 ENCOUNTER — Other Ambulatory Visit: Payer: Self-pay

## 2020-04-15 DIAGNOSIS — G4452 New daily persistent headache (NDPH): Secondary | ICD-10-CM

## 2020-04-15 IMAGING — MR MR HEAD W/O CM
10 series · 48 of 48 positions shown · non-contrast
Comparison: None.

CLINICAL DATA: Headache with intracranial hemorrhage suspected.

EXAM:
MRI HEAD WITHOUT CONTRAST
TECHNIQUE: Multiplanar, multiecho pulse sequences of the brain and surrounding
structures were obtained without intravenous contrast.

[Series 2: t1_se_sag · sagittal · 5.0mm · 0.45mm/px · 3 of 23 slices shown]
[im 1/23]
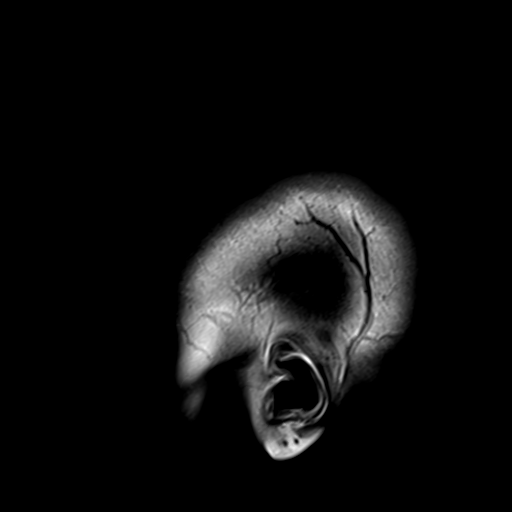
[im 12/23]
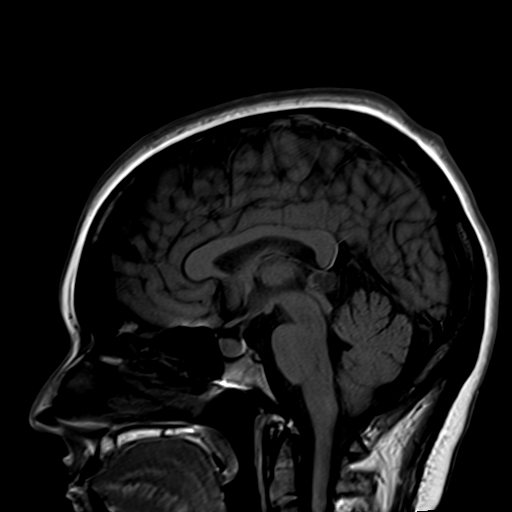
[im 23/23]
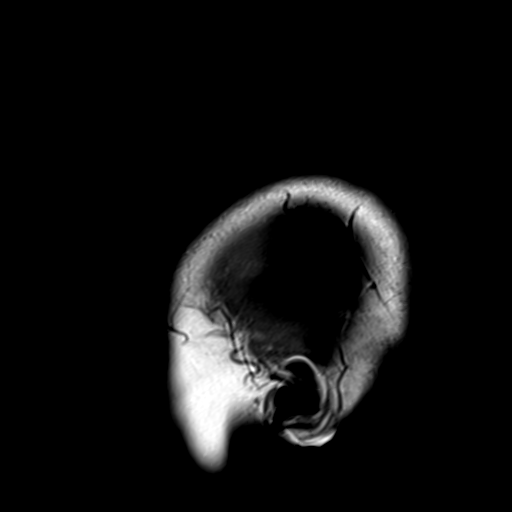

[Series 3: ep2d_diff_3 · axial · 3.0mm · 1.80mm/px · z∈[-71,+75]mm · 9 of 101 slices shown]
[im 1/101]
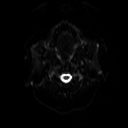
[im 13/101]
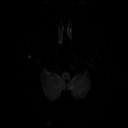
[im 26/101]
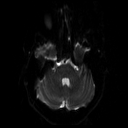
[im 38/101]
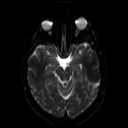
[im 51/101]
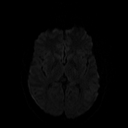
[im 63/101]
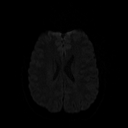
[im 76/101]
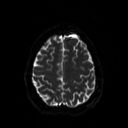
[im 88/101]
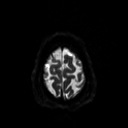
[im 101/101]
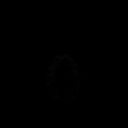

[Series 4: ep2d_diff_3_adc · axial · 3.0mm · 1.80mm/px · z∈[-71,+75]mm · 4 of 50 slices shown]
[im 1/50]
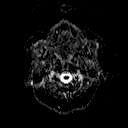
[im 17/50]
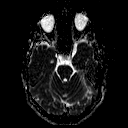
[im 33/50]
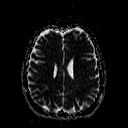
[im 50/50]
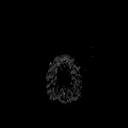

[Series 5: ep2d_diff_cor · coronal · 5.0mm · 1.77mm/px · 5 of 54 slices shown]
[im 1/54]
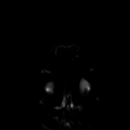
[im 14/54]
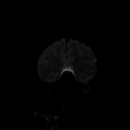
[im 27/54]
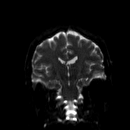
[im 40/54]
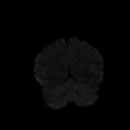
[im 54/54]
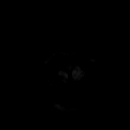

[Series 6: ep2d_diff_cor_adc · coronal · 5.0mm · 1.77mm/px · 2 of 27 slices shown]
[im 1/27]
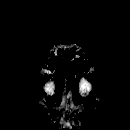
[im 27/27]
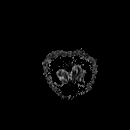

[Series 8: swi_images · axial · 2.0mm · 0.90mm/px · z∈[-76,+78]mm · 7 of 80 slices shown]
[im 1/80]
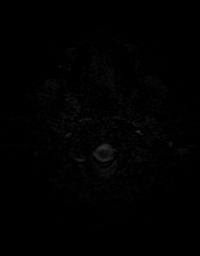
[im 14/80]
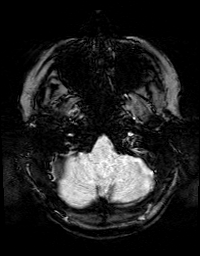
[im 27/80]
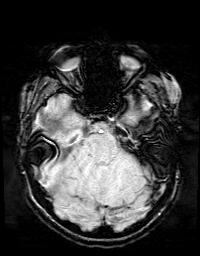
[im 40/80]
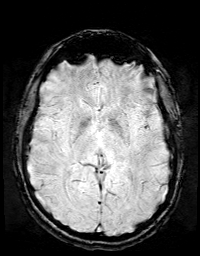
[im 53/80]
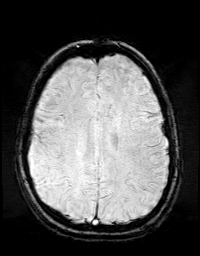
[im 66/80]
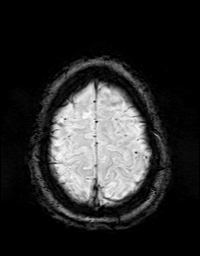
[im 80/80]
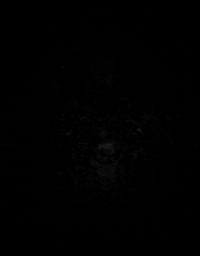

[Series 9: FLAIR · axial · 3.0mm · 0.43mm/px · z∈[-68,+73]mm · 2 of 25 slices shown]
[im 1/25]
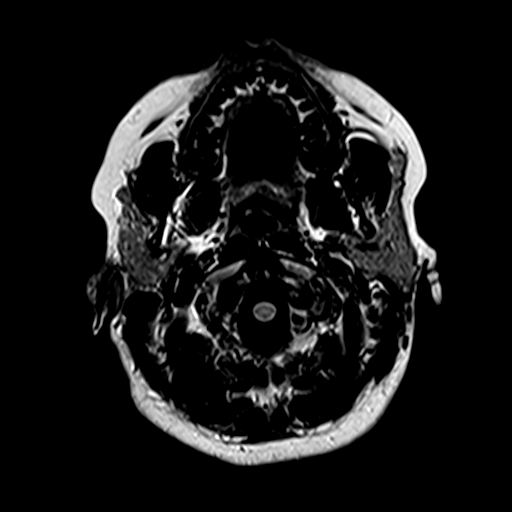
[im 25/25]
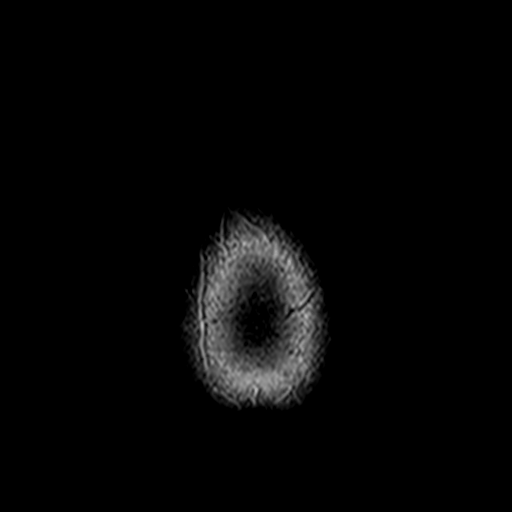

[Series 10: t2_tse_tra_512 · axial · 5.0mm · 0.60mm/px · z∈[-67,+68]mm · 2 of 24 slices shown]
[im 1/24]
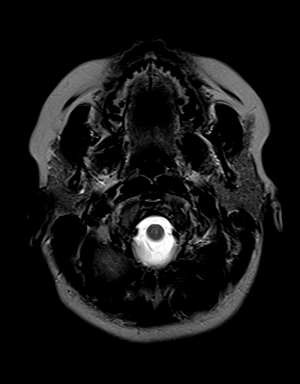
[im 24/24]
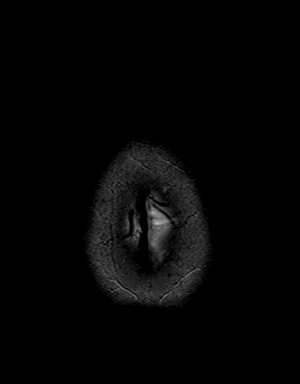

[Series 11: t1_mpr_tra · axial · 1.0mm · 0.72mm/px · z∈[-69,+71]mm · 12 of 144 slices shown]
[im 1/144]
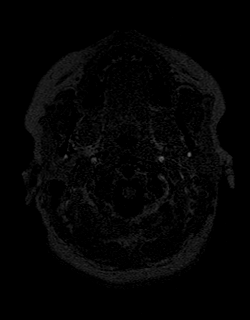
[im 14/144]
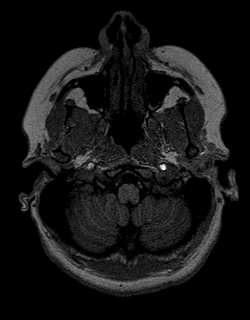
[im 27/144]
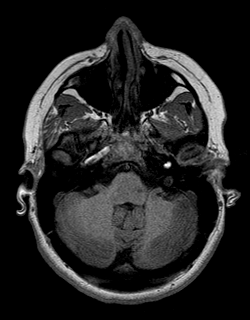
[im 40/144]
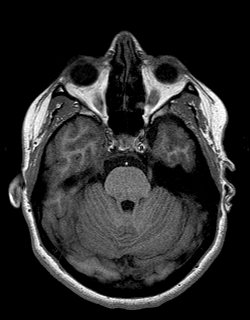
[im 53/144]
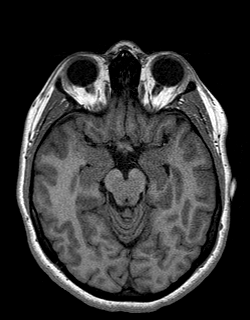
[im 66/144]
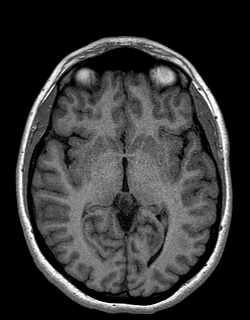
[im 79/144]
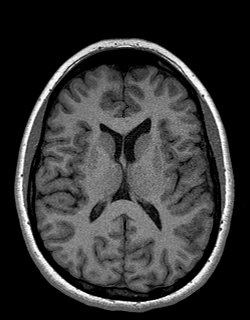
[im 92/144]
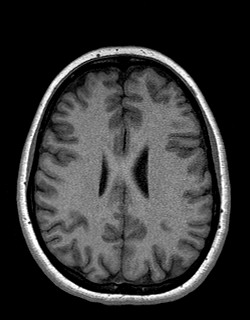
[im 105/144]
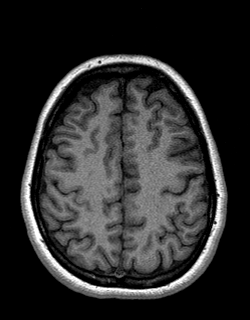
[im 118/144]
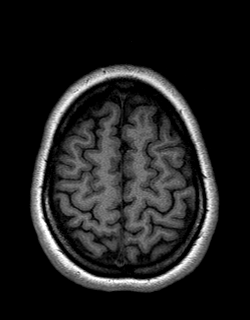
[im 131/144]
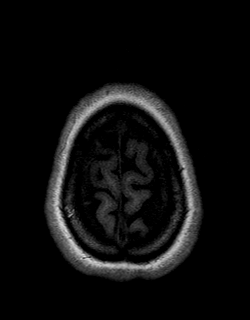
[im 144/144]
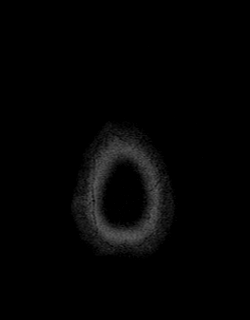

[Series 12: T2 · axial · 5.0mm · 0.45mm/px · z∈[-80,+85]mm · 2 of 28 slices shown]
[im 1/28]
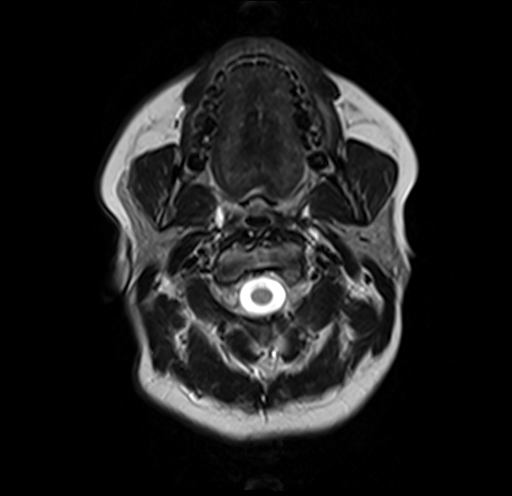
[im 28/28]
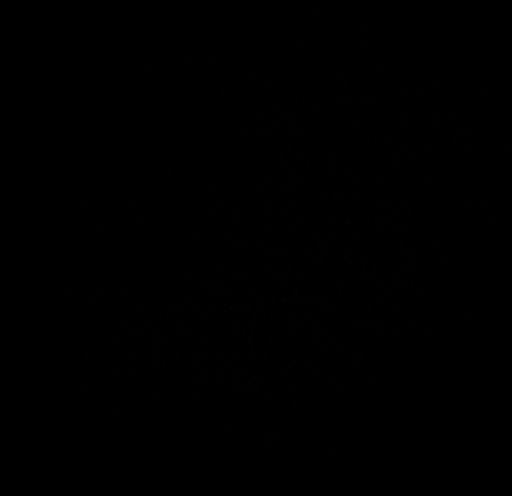

[48 of 48 positions shown; findings below may reference images not displayed]

FINDINGS: Brain: Cystic appearing pituitary lesion measuring 13 mm. There may
be a component that extends superiorly and rightward towards the
thalamus that has even more simple contents based on FLAIR imaging.
A thin septation is likely present the level of the midline cyst.

Tiny arachnoid cyst in the medial left middle cranial fossa
measuring 2 cm in length by 6 mm in thickness. No white matter
disease, infarct, hemorrhage, or hydrocephalus. Normal brain volume

Vascular: Normal flow voids

Skull and upper cervical spine: Normal marrow signal.

Sinuses/Orbits: Single opacified left ethmoid sinus.
IMPRESSION: 1. No specific cause for headache.
2. Incidental cystic appearing pineal lesion measuring 12 mm with
probable septation and rightward lobulation. Recommend a follow-up
scan in 6-12 months to document stability, preferably with contrast.
3. Tiny arachnoid cyst in the left middle cranial fossa.

## 2020-05-04 ENCOUNTER — Other Ambulatory Visit: Payer: Self-pay

## 2020-05-04 ENCOUNTER — Ambulatory Visit (INDEPENDENT_AMBULATORY_CARE_PROVIDER_SITE_OTHER): Payer: No Typology Code available for payment source | Admitting: Internal Medicine

## 2020-05-04 ENCOUNTER — Encounter: Payer: Self-pay | Admitting: Internal Medicine

## 2020-05-04 VITALS — BP 132/88 | HR 79 | Temp 98.2°F | Ht 63.0 in | Wt 190.0 lb

## 2020-05-04 DIAGNOSIS — I10 Essential (primary) hypertension: Secondary | ICD-10-CM

## 2020-05-04 DIAGNOSIS — E039 Hypothyroidism, unspecified: Secondary | ICD-10-CM

## 2020-05-04 LAB — BASIC METABOLIC PANEL
BUN: 18 mg/dL (ref 6–23)
CO2: 29 mEq/L (ref 19–32)
Calcium: 10.2 mg/dL (ref 8.4–10.5)
Chloride: 100 mEq/L (ref 96–112)
Creatinine, Ser: 1.01 mg/dL (ref 0.40–1.20)
GFR: 76.29 mL/min (ref >60.00)
Glucose, Bld: 88 mg/dL (ref 70–99)
Potassium: 4.2 mEq/L (ref 3.5–5.1)
Sodium: 138 mEq/L (ref 135–145)

## 2020-05-04 LAB — TSH: TSH: 4.12 u[IU]/mL (ref 0.35–4.50)

## 2020-05-04 MED ORDER — NEBIVOLOL HCL 5 MG PO TABS
5.0000 mg | ORAL_TABLET | Freq: Every day | ORAL | 1 refills | Status: DC
Start: 2020-05-04 — End: 2020-09-18

## 2020-05-04 MED ORDER — INDAPAMIDE 1.25 MG PO TABS: 1.2500 mg | ORAL_TABLET | Freq: Every day | ORAL | 1 refills | Status: DC

## 2020-05-04 MED ORDER — LEVOTHYROXINE SODIUM 50 MCG PO TABS
50.0000 ug | ORAL_TABLET | Freq: Every day | ORAL | 1 refills | Status: DC
Start: 2020-05-04 — End: 2020-09-18

## 2020-05-04 NOTE — Patient Instructions (Signed)

## 2020-05-04 NOTE — Progress Notes (Signed)
Subjective:  Patient ID: Anita Foster, female    DOB: Jul 05, 1990  Age: 30 y.o. MRN: [id number]  CC: Hypertension and Hypothyroidism  This visit occurred during the SARS-CoV-2 public health emergency.  Safety protocols were in place, including screening questions prior to the visit, additional usage of staff PPE, and extensive cleaning of exam room while observing appropriate contact time as indicated for disinfecting solutions.    HPI Anita Foster presents for f/up - Her headache has resolved and she tells me her blood pressure has been well controlled. She has been working on her lifestyle modifications in addition to taking the medications.  Outpatient Medications Prior to Visit  Medication Sig Dispense Refill  . cetirizine (ZYRTEC) 10 MG chewable tablet Chew 10 mg by mouth daily.    . Cholecalciferol 1.25 MG (50000 UT) capsule Take 1 capsule (50,000 Units total) by mouth once a week. 12 capsule 0  . indapamide (LOZOL) 1.25 MG tablet Take 1 tablet (1.25 mg total) by mouth daily. 90 tablet 0  . levothyroxine (SYNTHROID) 25 MCG tablet Take 1 tablet (25 mcg total) by mouth daily before breakfast. 90 tablet 0  . nebivolol (BYSTOLIC) 5 MG tablet Take 1 tablet (5 mg total) by mouth daily. 90 tablet 0   No facility-administered medications prior to visit.    ROS Review of Systems  Constitutional: Negative for appetite change, diaphoresis and fatigue.  HENT: Negative.   Eyes: Negative.   Respiratory: Negative for cough, chest tightness, shortness of breath and wheezing.   Cardiovascular: Negative for chest pain, palpitations and leg swelling.  Gastrointestinal: Negative for abdominal pain, constipation, diarrhea, nausea and vomiting.  Endocrine: Negative.  Negative for cold intolerance and heat intolerance.  Genitourinary: Negative.  Negative for difficulty urinating.  Musculoskeletal: Negative.  Negative for arthralgias.  Skin: Negative.  Negative for color change and pallor.   Neurological: Negative.  Negative for weakness.  Hematological: Negative for adenopathy. Does not bruise/bleed easily.  Psychiatric/Behavioral: Negative.     Objective:  BP 132/88   Pulse 79   Temp 98.2 F (36.8 C) (Oral)   Ht 5\' 3"  (1.6 m)   Wt 190 lb (86.2 kg)   SpO2 98%   BMI 33.66 kg/m   BP Readings from Last 3 Encounters:  05/04/20 132/88  03/20/20 (!) 170/114  08/09/19 (!) 138/98    Wt Readings from Last 3 Encounters:  05/04/20 190 lb (86.2 kg)  03/20/20 202 lb (91.6 kg)  08/09/19 198 lb (89.8 kg)    Physical Exam Vitals reviewed.  Constitutional:      Appearance: Normal appearance.  HENT:     Nose: Nose normal.     Mouth/Throat:     Mouth: Mucous membranes are moist.  Eyes:     General: No scleral icterus.    Conjunctiva/sclera: Conjunctivae normal.  Cardiovascular:     Rate and Rhythm: Normal rate and regular rhythm.     Heart sounds: No murmur heard.   Pulmonary:     Effort: Pulmonary effort is normal.     Breath sounds: No stridor. No wheezing, rhonchi or rales.  Abdominal:     General: Abdomen is flat.     Palpations: There is no mass.     Tenderness: There is no abdominal tenderness. There is no guarding.  Musculoskeletal:        General: Normal range of motion.     Cervical back: Neck supple.     Right lower leg: No edema.  Left lower leg: No edema.  Lymphadenopathy:     Cervical: No cervical adenopathy.  Skin:    General: Skin is warm and dry.     Coloration: Skin is not pale.  Neurological:     General: No focal deficit present.     Mental Status: She is alert. Mental status is at baseline.  Psychiatric:        Mood and Affect: Mood normal.        Behavior: Behavior normal.     Lab Results  Component Value Date   WBC 6.5 03/20/2020   HGB 14.2 03/20/2020   HCT 41.4 03/20/2020   PLT 240.0 03/20/2020   GLUCOSE 88 05/04/2020   CHOL 145 03/20/2020   TRIG 198.0 (H) 03/20/2020   HDL 37.60 (L) 03/20/2020   LDLCALC 68  03/20/2020   ALT 19 03/20/2020   AST 18 03/20/2020   NA 138 05/04/2020   K 4.2 05/04/2020   CL 100 05/04/2020   CREATININE 1.01 05/04/2020   BUN 18 05/04/2020   CO2 29 05/04/2020   TSH 4.12 05/04/2020   HGBA1C 4.8 10/28/2018    MR Brain Wo Contrast  Result Date: 04/16/2020 CLINICAL DATA:  Headache with intracranial hemorrhage suspected. EXAM: MRI HEAD WITHOUT CONTRAST TECHNIQUE: Multiplanar, multiecho pulse sequences of the brain and surrounding structures were obtained without intravenous contrast. COMPARISON:  None. 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 There may be a component that extends superiorly and rightward towards the thalamus that has even more simple contents based on FLAIR imaging. A thin septation is likely present the level of the midline cyst. Tiny arachnoid cyst in the medial left middle cranial fossa measuring 2 cm in length by 6 mm in thickness. No white matter disease, infarct, hemorrhage, or hydrocephalus. Normal brain volume Vascular: Normal flow voids Skull and upper cervical spine: Normal marrow signal. Sinuses/Orbits: Single opacified left ethmoid sinus. IMPRESSION: 1. No specific cause for headache. 2. 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 Recommend a follow-up scan in 6-12 months to document stability, preferably with contrast. 3. Tiny arachnoid cyst in the left middle cranial fossa. Electronically Signed   By: Anita Foster M.D.   On: 04/16/2020 08:07    Assessment & Plan:   [name] was seen today for hypertension and hypothyroidism.  Diagnoses and all orders for this visit:  Acquired hypothyroidism- Her TSH is just above 4 so I recommended an increase in her dose of levothyroxine. -     TSH; Future -     TSH -     levothyroxine (SYNTHROID) 50 MCG tablet; Take 1 tablet (50 mcg total) by mouth daily.  Malignant hypertension- Her blood pressure is well  controlled. Electrolytes and renal function are normal. -     Basic metabolic panel; Future -     Basic metabolic panel -     indapamide (LOZOL) 1.25 MG tablet; Take 1 tablet (1.25 mg total) by mouth daily. -     nebivolol (BYSTOLIC) 5 MG tablet; Take 1 tablet (5 mg total) by mouth daily.   I have discontinued Lester M. Cuen's levothyroxine. I am also having her start on levothyroxine. Additionally, I am having her maintain her cetirizine, Cholecalciferol, indapamide, and nebivolol.  Meds ordered this encounter  Medications  . levothyroxine (SYNTHROID) 50 MCG tablet    Sig: Take 1 tablet (50 mcg total) by mouth daily.    Dispense:  90 tablet    Refill:  1  . indapamide (LOZOL) 1.25 MG tablet    Sig: Take 1 tablet (1.25 mg total) by mouth daily.    Dispense:  90 tablet    Refill:  1  . nebivolol (BYSTOLIC) 5 MG tablet    Sig: Take 1 tablet (5 mg total) by mouth daily.    Dispense:  90 tablet    Refill:  1     Follow-up: Return in about 6 months (around 11/01/2020).  Sanda Linger, MD

## 2020-06-15 ENCOUNTER — Other Ambulatory Visit: Payer: Self-pay | Admitting: Internal Medicine

## 2020-06-15 DIAGNOSIS — E559 Vitamin D deficiency, unspecified: Secondary | ICD-10-CM

## 2020-08-31 ENCOUNTER — Ambulatory Visit: Payer: No Typology Code available for payment source | Admitting: Internal Medicine

## 2020-09-15 ENCOUNTER — Telehealth: Payer: Self-pay | Admitting: Internal Medicine

## 2020-09-15 NOTE — Telephone Encounter (Signed)
   Patient requesting refill for levothyroxine (SYNTHROID) 50 MCG tablet  nebivolol (BYSTOLIC) 5 MG tablet  Pharmacy  [facility] [phone] - [place], [place] - [address]

## 2020-09-18 ENCOUNTER — Other Ambulatory Visit: Payer: Self-pay | Admitting: Internal Medicine

## 2020-09-18 DIAGNOSIS — E039 Hypothyroidism, unspecified: Secondary | ICD-10-CM

## 2020-09-18 DIAGNOSIS — I10 Essential (primary) hypertension: Secondary | ICD-10-CM

## 2020-09-18 MED ORDER — LEVOTHYROXINE SODIUM 50 MCG PO TABS: 50.0000 ug | ORAL_TABLET | Freq: Every day | ORAL | 0 refills | Status: DC

## 2020-09-18 MED ORDER — NEBIVOLOL HCL 5 MG PO TABS: 5.0000 mg | ORAL_TABLET | Freq: Every day | ORAL | 0 refills | Status: DC

## 2020-09-18 MED ORDER — INDAPAMIDE 1.25 MG PO TABS: 1.2500 mg | ORAL_TABLET | Freq: Every day | ORAL | 0 refills | Status: DC

## 2020-10-23 ENCOUNTER — Other Ambulatory Visit: Payer: Self-pay | Admitting: Internal Medicine

## 2020-10-23 DIAGNOSIS — E348 Other specified endocrine disorders: Secondary | ICD-10-CM

## 2020-11-06 ENCOUNTER — Ambulatory Visit: Payer: No Typology Code available for payment source | Admitting: Internal Medicine

## 2020-11-18 ENCOUNTER — Ambulatory Visit
Admission: RE | Admit: 2020-11-18 | Discharge: 2020-11-18 | Disposition: A | Discharge status: Home / Self Care | Payer: No Typology Code available for payment source | Source: Ambulatory Visit | Attending: Internal Medicine | Admitting: Internal Medicine

## 2020-11-18 ENCOUNTER — Other Ambulatory Visit: Payer: Self-pay

## 2020-11-18 DIAGNOSIS — E348 Other specified endocrine disorders: Secondary | ICD-10-CM

## 2020-11-18 MED ORDER — GADOBENATE DIMEGLUMINE 529 MG/ML IV SOLN
10.0000 mL | Freq: Once | INTRAVENOUS | Status: AC | PRN
  Administered 2020-11-18: 10 mL via INTRAVENOUS

## 2020-11-29 ENCOUNTER — Encounter: Payer: Self-pay | Admitting: Internal Medicine

## 2020-11-29 ENCOUNTER — Other Ambulatory Visit: Payer: Self-pay

## 2020-11-29 ENCOUNTER — Ambulatory Visit (INDEPENDENT_AMBULATORY_CARE_PROVIDER_SITE_OTHER): Payer: No Typology Code available for payment source | Admitting: Internal Medicine

## 2020-11-29 VITALS — BP 136/84 | HR 98 | Temp 98.7°F | Resp 16 | Ht 63.0 in | Wt 200.0 lb

## 2020-11-29 DIAGNOSIS — E039 Hypothyroidism, unspecified: Secondary | ICD-10-CM

## 2020-11-29 DIAGNOSIS — I10 Essential (primary) hypertension: Secondary | ICD-10-CM | POA: Diagnosis not present

## 2020-11-29 NOTE — Progress Notes (Signed)
Subjective:  Patient ID: Anita Foster, female    DOB: Nov 18, 1990  Age: 30 y.o. MRN: [id number]  CC: Hypothyroidism and Hypertension  This visit occurred during the SARS-CoV-2 public health emergency.  Safety protocols were in place, including screening questions prior to the visit, additional usage of staff PPE, and extensive cleaning of exam room while observing appropriate contact time as indicated for disinfecting solutions.    HPI Anita Foster presents for f/up -   She has felt well recently and offers no complaints.  She tells me her blood pressure has been well controlled.  She has had no recent headaches.  Outpatient Medications Prior to Visit  Medication Sig Dispense Refill   cetirizine (ZYRTEC) 10 MG chewable tablet Chew 10 mg by mouth daily.     D3-50 1.25 MG (50000 UT) capsule TAKE 1 CAPSULE BY MOUTH ONE TIME PER WEEK 12 capsule 0   indapamide (LOZOL) 1.25 MG tablet Take 1 tablet (1.25 mg total) by mouth daily. 90 tablet 0   levothyroxine (SYNTHROID) 50 MCG tablet Take 1 tablet (50 mcg total) by mouth daily. 90 tablet 0   nebivolol (BYSTOLIC) 5 MG tablet Take 1 tablet (5 mg total) by mouth daily. 90 tablet 0   No facility-administered medications prior to visit.    ROS Review of Systems  Constitutional:  Positive for unexpected weight change (wt gain). Negative for chills, diaphoresis and fatigue.  HENT: Negative.    Eyes: Negative.   Respiratory:  Negative for apnea, cough, shortness of breath and wheezing.   Cardiovascular:  Negative for chest pain, palpitations and leg swelling.  Gastrointestinal:  Negative for abdominal pain, diarrhea, nausea and vomiting.  Endocrine: Negative.   Genitourinary: Negative.  Negative for difficulty urinating.  Musculoskeletal: Negative.  Negative for arthralgias.  Skin: Negative.   Neurological:  Negative for dizziness, weakness and light-headedness.  Hematological:  Negative for adenopathy. Does not bruise/bleed easily.   Psychiatric/Behavioral: Negative.     Objective:  BP 136/84 (BP Location: Left Arm, Patient Position: Sitting, Cuff Size: Large)   Pulse 98   Temp 98.7 F (37.1 C) (Oral)   Resp 16   Ht 5\' 3"  (1.6 m)   Wt 200 lb (90.7 kg)   LMP 11/13/2020 (Exact Date)   SpO2 100%   BMI 35.43 kg/m   BP Readings from Last 3 Encounters:  11/29/20 136/84  05/04/20 132/88  03/20/20 (!) 170/114    Wt Readings from Last 3 Encounters:  11/29/20 200 lb (90.7 kg)  05/04/20 190 lb (86.2 kg)  03/20/20 202 lb (91.6 kg)    Physical Exam  Lab Results  Component Value Date   WBC 6.5 03/20/2020   HGB 14.2 03/20/2020   HCT 41.4 03/20/2020   PLT 240.0 03/20/2020   GLUCOSE 88 05/04/2020   CHOL 145 03/20/2020   TRIG 198.0 (H) 03/20/2020   HDL 37.60 (L) 03/20/2020   LDLCALC 68 03/20/2020   ALT 19 03/20/2020   AST 18 03/20/2020   NA 138 05/04/2020   K 4.2 05/04/2020   CL 100 05/04/2020   CREATININE 1.01 05/04/2020   BUN 18 05/04/2020   CO2 29 05/04/2020   TSH 4.12 05/04/2020   HGBA1C 4.8 10/28/2018    MR BRAIN W WO CONTRAST  Result Date: 11/19/2020 CLINICAL DATA:  Brain/CNS neoplasm, surveillance. Cyst of the pineal gland. EXAM: MRI HEAD WITHOUT AND WITH CONTRAST TECHNIQUE: Multiplanar, multiecho pulse sequences of the brain and surrounding structures were obtained without and with intravenous contrast. CONTRAST:  55mL MULTIHANCE GADOBENATE DIMEGLUMINE 529 MG/ML IV SOLN COMPARISON:  04/15/2020 FINDINGS: Brain: Diffusion imaging does not show any acute or subacute infarction. The brainstem and cerebellum are normal. As seen previously, there is a tiny arachnoid cyst at the anteromedial middle cranial fossa on the left. No evidence of brain parenchymal stroke, intra-axial mass, hemorrhage, hydrocephalus or extra-axial collection. Stable appearance of the pineal gland which measures in total 13 x 10 x 8 mm and contains a cyst with a thin septation. The enhancement pattern is normal. 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 Lack of change since 04/15/2020 argues against any significant abnormality. The technologist performed a pituitary study on this patient as well, which shows the pituitary gland to be normal in size for a female of this age. The gland appears normal with a normal enhancement pattern. Vascular: Major vessels at the base of the brain show flow. Skull and upper cervical spine: Negative Sinuses/Orbits: Clear/normal Other: None IMPRESSION: Continued normal appearance of the brain itself. Insignificant small arachnoid cyst at the anteromedial middle cranial fossa on the left. Stable appearance of the pineal gland which measures in total 13 x 10 x 8 mm and contains a cyst with a septation. 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 Additional follow-up is not strongly recommended and would be optional. Pituitary study also performed on this exam, which showed a normal appearance of the pituitary gland. Electronically Signed   By: Paulina Fusi M.D.   On: 11/19/2020 17:52    Assessment & Plan:   Deyci was seen today for hypothyroidism and hypertension.  Diagnoses and all orders for this visit:  Malignant hypertension- Her blood pressure is adequately well controlled.  I will monitor her electrolytes and renal function and will continue the current agents. -     Basic metabolic panel; Future -     indapamide (LOZOL) 1.25 MG tablet; Take 1 tablet (1.25 mg total) by mouth daily. -     nebivolol (BYSTOLIC) 5 MG tablet; Take 1 tablet (5 mg total) by mouth daily.  Acquired hypothyroidism- I will monitor her TSH and will adjust her dose if indicated. -     TSH; Future -     levothyroxine (SYNTHROID) 50 MCG tablet; Take 1 tablet (50 mcg total) by mouth daily.  I am having Anita Foster maintain her cetirizine, D3-50, indapamide, levothyroxine, and nebivolol.  Meds ordered this encounter  Medications   indapamide (LOZOL)  1.25 MG tablet    Sig: Take 1 tablet (1.25 mg total) by mouth daily.    Dispense:  90 tablet    Refill:  1   levothyroxine (SYNTHROID) 50 MCG tablet    Sig: Take 1 tablet (50 mcg total) by mouth daily.    Dispense:  90 tablet    Refill:  1   nebivolol (BYSTOLIC) 5 MG tablet    Sig: Take 1 tablet (5 mg total) by mouth daily.    Dispense:  90 tablet    Refill:  1      Follow-up: Return in about 6 months (around 06/01/2021).  Sanda Linger, MD

## 2020-11-29 NOTE — Patient Instructions (Signed)

## 2020-12-01 MED ORDER — INDAPAMIDE 1.25 MG PO TABS
1.2500 mg | ORAL_TABLET | Freq: Every day | ORAL | 1 refills | Status: DC
Start: 2020-12-01 — End: 2021-11-23

## 2020-12-01 MED ORDER — NEBIVOLOL HCL 5 MG PO TABS
5.0000 mg | ORAL_TABLET | Freq: Every day | ORAL | 1 refills | Status: DC
Start: 2020-12-01 — End: 2021-11-23

## 2020-12-01 MED ORDER — LEVOTHYROXINE SODIUM 50 MCG PO TABS: 50.0000 ug | ORAL_TABLET | Freq: Every day | ORAL | 1 refills | Status: DC

## 2020-12-05 ENCOUNTER — Other Ambulatory Visit (INDEPENDENT_AMBULATORY_CARE_PROVIDER_SITE_OTHER): Payer: No Typology Code available for payment source

## 2020-12-05 DIAGNOSIS — E039 Hypothyroidism, unspecified: Secondary | ICD-10-CM | POA: Diagnosis not present

## 2020-12-05 DIAGNOSIS — I10 Essential (primary) hypertension: Secondary | ICD-10-CM | POA: Diagnosis not present

## 2020-12-05 LAB — BASIC METABOLIC PANEL
BUN: 14 mg/dL (ref 6–23)
CO2: 27 mEq/L (ref 19–32)
Calcium: 10.2 mg/dL (ref 8.4–10.5)
Chloride: 100 mEq/L (ref 96–112)
Creatinine, Ser: 0.77 mg/dL (ref 0.40–1.20)
GFR: 105.21 mL/min (ref >60.00)
Glucose, Bld: 84 mg/dL (ref 70–99)
Potassium: 3.7 mEq/L (ref 3.5–5.1)
Sodium: 137 mEq/L (ref 135–145)

## 2020-12-05 LAB — TSH: TSH: 4.7 u[IU]/mL (ref 0.35–5.50)

## 2021-01-04 NOTE — Progress Notes (Deleted)
30 y.o. G0P0000 Single Caucasian female here for annual exam.    PCP:     No LMP recorded.           Sexually active: {yes no:314532}  The current method of family planning is {contraception:315051}.    Exercising: {yes no:314532}  {types:19826} Smoker:  no  Health Maintenance: Pap: 10-01-17 Neg History of abnormal Pap:  no MMG:  n/a Colonoscopy:  n/a BMD:   n/a  Result  n/a TDaP:  2013 Gardasil:   yes HIV: 03-20-20 NR Hep C: 03-20-20 Neg Screening Labs:  Hb today: ***, Urine today: ***   reports that she has never smoked. She has never used smokeless tobacco. She reports current alcohol use of about 1.0 standard drink per week. She reports that she does not use drugs.  Past Medical History:  Diagnosis Date   Hypertriglyceridemia 2020    No past surgical history on file.  Current Outpatient Medications  Medication Sig Dispense Refill   cetirizine (ZYRTEC) 10 MG chewable tablet Chew 10 mg by mouth daily.     D3-50 1.25 MG (50000 UT) capsule TAKE 1 CAPSULE BY MOUTH ONE TIME PER WEEK 12 capsule 0   indapamide (LOZOL) 1.25 MG tablet Take 1 tablet (1.25 mg total) by mouth daily. 90 tablet 1   levothyroxine (SYNTHROID) 50 MCG tablet Take 1 tablet (50 mcg total) by mouth daily. 90 tablet 1   nebivolol (BYSTOLIC) 5 MG tablet Take 1 tablet (5 mg total) by mouth daily. 90 tablet 1   No current facility-administered medications for this visit.    Family History  Problem Relation Age of Onset   Breast cancer Mother 28       A & W   Hypertension Father    Hyperlipidemia Father    Anxiety disorder Brother    Diabetes Paternal Grandmother    Hypertension Paternal Grandmother    Stroke Paternal Grandmother    Hypertension Paternal Grandfather     Review of Systems  Exam:   There were no vitals taken for this visit.    General appearance: alert, cooperative and appears stated age Head: normocephalic, without obvious abnormality, atraumatic Neck: no adenopathy, supple,  symmetrical, trachea midline and thyroid normal to inspection and palpation Lungs: clear to auscultation bilaterally Breasts: normal appearance, no masses or tenderness, No nipple retraction or dimpling, No nipple discharge or bleeding, No axillary adenopathy Heart: regular rate and rhythm Abdomen: soft, non-tender; no masses, no organomegaly Extremities: extremities normal, atraumatic, no cyanosis or edema Skin: skin color, texture, turgor normal. No rashes or lesions Lymph nodes: cervical, supraclavicular, and axillary nodes normal. Neurologic: grossly normal  Pelvic: External genitalia:  no lesions              No abnormal inguinal nodes palpated.              Urethra:  normal appearing urethra with no masses, tenderness or lesions              Bartholins and Skenes: normal                 Vagina: normal appearing vagina with normal color and discharge, no lesions              Cervix: no lesions              Pap taken: {yes no:314532} Bimanual Exam:  Uterus:  normal size, contour, position, consistency, mobility, non-tender  Adnexa: no mass, fullness, tenderness              Rectal exam: {yes no:314532}.  Confirms.              Anus:  normal sphincter tone, no lesions  Chaperone was present for exam:  ***  Assessment:   Well woman visit with gynecologic exam.   Plan: Mammogram screening discussed. Self breast awareness reviewed. Pap and HR HPV as above. Guidelines for Calcium, Vitamin D, regular exercise program including cardiovascular and weight bearing exercise.   Follow up annually and prn.   Additional counseling given.  {yes [id number]. _______ minutes face to face time of which over 50% was spent in counseling.    After visit summary provided.

## 2021-01-05 ENCOUNTER — Ambulatory Visit: Payer: No Typology Code available for payment source | Admitting: Obstetrics and Gynecology

## 2021-01-22 NOTE — Progress Notes (Signed)
30 y.o. G0P0000 Single Caucasian female here for annual exam.    Patient wants to discuss birth control options. Mother had estrogen positive breast cancer, so she is interested in low estrogen birth control. She will need pregnancy prevention.   Patient with spotting between cycles and spots after "hiking" or exercise. The spotting is 1/2 way through her cycle and can last 2 days, maximum. Cramping with cycles is manageable.   Normal TSH 12/05/20.   Working at [employer].  PCP:  Sanda Linger, MD  Patient's last menstrual period was 01/04/2021 (exact date).     Period Cycle (Days): 30 Period Duration (Days): 6-7 Period Pattern: Regular Menstrual Flow: Moderate Menstrual Control: Tampon Menstrual Control Change Freq (Hours): changes super tampon every 4-6 hours on heaviest day Dysmenorrhea:  (cramping prior to cycle)     Sexually active: No.  The current method of family planning is abstinence.    Exercising: No.  The patient does not participate in regular exercise at present. Smoker:  no  Health Maintenance: Pap: 10-01-17 Neg History of abnormal Pap:  no MMG: n/a Colonoscopy:  n/a BMD:   n/a  Result  n/a TDaP:  2013 Gardasil:   yes HIV: 03-20-20 NR Hep C: 03-20-20 Neg Screening Labs:   PCP.   reports that she has never smoked. She has never used smokeless tobacco. She reports that she does not currently use alcohol. She reports that she does not use drugs.  Past Medical History:  Diagnosis Date   Hypertension    Hypertriglyceridemia 2020   Thyroid disease    hypothyroidism    History reviewed. No pertinent surgical history.  Current Outpatient Medications  Medication Sig Dispense Refill   cetirizine (ZYRTEC) 10 MG chewable tablet Chew 10 mg by mouth daily.     D3-50 1.25 MG (50000 UT) capsule TAKE 1 CAPSULE BY MOUTH ONE TIME PER WEEK 12 capsule 0   indapamide (LOZOL) 1.25 MG tablet Take 1 tablet (1.25 mg total) by mouth daily. 90 tablet 1    levothyroxine (SYNTHROID) 50 MCG tablet Take 1 tablet (50 mcg total) by mouth daily. 90 tablet 1   nebivolol (BYSTOLIC) 5 MG tablet Take 1 tablet (5 mg total) by mouth daily. 90 tablet 1   No current facility-administered medications for this visit.    Family History  Problem Relation Age of Onset   Breast cancer Mother 73       A & W   Hypertension Father    Hyperlipidemia Father    Anxiety disorder Brother    Diabetes Paternal Grandmother    Hypertension Paternal Grandmother    Stroke Paternal Grandmother    Hypertension Paternal Grandfather     Review of Systems  All other systems reviewed and are negative.  Exam:   BP 116/80   Pulse 98   Ht 5\' 3"  (1.6 m)   Wt 206 lb (93.4 kg)   LMP 01/04/2021 (Exact Date)   SpO2 100%   BMI 36.49 kg/m     General appearance: alert, cooperative and appears stated age Head: normocephalic, without obvious abnormality, atraumatic Neck: no adenopathy, supple, symmetrical, trachea midline and thyroid normal to inspection and palpation Lungs: clear to auscultation bilaterally Breasts: normal appearance, no masses or tenderness, No nipple retraction or dimpling, No nipple discharge or bleeding, No axillary adenopathy Heart: regular rate and rhythm Abdomen: soft, non-tender; no masses, no organomegaly Extremities: extremities normal, atraumatic, no cyanosis or edema Skin: skin color, texture, turgor normal. No rashes or lesions Lymph  nodes: cervical, supraclavicular, and axillary nodes normal. Neurologic: grossly normal  Pelvic: External genitalia:  no lesions              No abnormal inguinal nodes palpated.              Urethra:  normal appearing urethra with no masses, tenderness or lesions              Bartholins and Skenes: normal                 Vagina: normal appearing vagina with normal color and discharge, no lesions              Cervix: no lesions              Pap taken:  yes. Bimanual Exam:  Uterus:  normal size, contour,  position, consistency, mobility, non-tender              Adnexa: no mass, fullness, tenderness           Chaperone was present for exam:  Malen Gauze, CMA  Assessment:   Well woman visit with gynecologic exam. HTN.  FH premenopausal breast cancer in mother, age 28 Irregular cycles. Midcycle bleeding.   Plan: Mammogram screening starting age 63 yo.  Self breast awareness reviewed. Pap and HR HPV as above. Guidelines for Calcium, Vitamin D, regular exercise program including cardiovascular and weight bearing exercise. We discussed Micronor, Depo Provera, IUDs.  She will start Micronor.  Instructed in use.  Labs with PCP.  Flu vaccine and Covid booster discussed.  Follow up annually and prn.    After visit summary provided.

## 2021-01-23 ENCOUNTER — Other Ambulatory Visit: Payer: Self-pay

## 2021-01-23 ENCOUNTER — Ambulatory Visit (INDEPENDENT_AMBULATORY_CARE_PROVIDER_SITE_OTHER): Payer: No Typology Code available for payment source | Admitting: Obstetrics and Gynecology

## 2021-01-23 ENCOUNTER — Other Ambulatory Visit (HOSPITAL_COMMUNITY)
Admission: RE | Admit: 2021-01-23 | Discharge: 2021-01-23 | Disposition: A | Discharge status: Home / Self Care | Payer: No Typology Code available for payment source | Source: Ambulatory Visit | Attending: Obstetrics and Gynecology | Admitting: Obstetrics and Gynecology

## 2021-01-23 ENCOUNTER — Encounter: Payer: Self-pay | Admitting: Obstetrics and Gynecology

## 2021-01-23 VITALS — BP 116/80 | HR 98 | Ht 63.0 in | Wt 206.0 lb

## 2021-01-23 DIAGNOSIS — Z01419 Encounter for gynecological examination (general) (routine) without abnormal findings: Secondary | ICD-10-CM | POA: Diagnosis not present

## 2021-01-23 MED ORDER — NORETHINDRONE 0.35 MG PO TABS: 1.0000 | ORAL_TABLET | Freq: Every day | ORAL | 3 refills | Status: DC

## 2021-01-23 NOTE — Patient Instructions (Signed)

## 2021-01-24 LAB — CYTOLOGY - PAP: Diagnosis: NEGATIVE

## 2021-02-27 ENCOUNTER — Other Ambulatory Visit: Payer: Self-pay | Admitting: Internal Medicine

## 2021-02-27 DIAGNOSIS — E559 Vitamin D deficiency, unspecified: Secondary | ICD-10-CM

## 2021-09-01 ENCOUNTER — Other Ambulatory Visit: Payer: Self-pay | Admitting: Internal Medicine

## 2021-09-01 DIAGNOSIS — I10 Essential (primary) hypertension: Secondary | ICD-10-CM

## 2021-11-20 ENCOUNTER — Telehealth: Payer: Self-pay | Admitting: Internal Medicine

## 2021-11-20 NOTE — Telephone Encounter (Signed)
Caller & Relationship to patient: [facility]  Call back number: [phone]  Date of last office visit: 11/29/20  Date of next office visit: 01/01/22  Medication(s) to be refilled:  indapamide (LOZOL) 1.25 MG tablet   nebivolol (BYSTOLIC) 5 MG tablet   Preferred Pharmacy:  [id number] - [place], [place] - [address] AT [place] [address] Phone:  [phone]  Fax:  [phone]

## 2021-11-20 NOTE — Progress Notes (Unsigned)
GYNECOLOGY  VISIT   HPI: 31 y.o.   Single  Caucasian  female   G0P0000 with Patient's last menstrual period was 11/16/2021 (exact date).   here for bump on rt outer vulva. Reports has been slowly growing over the past 3 months or so. Reports trying to get something out of it with no luck. Pt reports she does shave. Denies pain.     Never had this before.   Tried Neosporin and doing soaks, and these did not help.   Shaves in the area.   Used to get abscesses under her arm, that would hurt.  This does not hurt.   GYNECOLOGIC HISTORY: Patient's last menstrual period was 11/16/2021 (exact date). 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 No pertinent surgical history.  Current Outpatient Medications  Medication Sig Dispense Refill   Cholecalciferol (VITAMIN D3) 1.25 MG (50000 UT) CAPS TAKE 1 CAPSULE BY MOUTH ONE TIME PER WEEK 12 capsule 0   indapamide (LOZOL) 1.25 MG tablet Take 1 tablet (1.25 mg total) by mouth daily. 30 tablet 0   levothyroxine (SYNTHROID) 50 MCG tablet Take 1 tablet (50 mcg total) by mouth daily. 90 tablet 1   nebivolol (BYSTOLIC) 5 MG tablet Take 1 tablet (5 mg total) by mouth daily. 30 tablet 0   norethindrone (ORTHO MICRONOR) 0.35 MG  tablet Take 1 tablet (0.35 mg total) by mouth daily. 84 tablet 3   No current facility-administered medications for this visit.     ALLERGIES: Sulfa antibiotics  Family History  Problem Relation Age of Onset   Breast cancer Mother 29       A & W   Hypertension Father    Hyperlipidemia Father    Anxiety disorder Brother    Diabetes Paternal Grandmother    Hypertension Paternal Grandmother    Stroke Paternal Grandmother    Hypertension Paternal Grandfather     Social History   Socioeconomic History   Marital status: Single    Spouse name: Not on file   Number of children: Not on file   Years of education: Not on file   Highest education level: Not on file  Occupational History   Not on file  Tobacco Use   Smoking status: Never   Smokeless tobacco: Never  Vaping Use   Vaping Use: Never used  Substance and Sexual Activity   Alcohol use: Not Currently    Comment: 2 drinks/month   Drug use: Never   Sexual activity: Never    Birth control/protection:  Abstinence  Other Topics Concern   Not on file  Social History Narrative   Not on file   Social Determinants of Health   Financial Resource Strain: Not on file  Food Insecurity: Not on file  Transportation Needs: Not on file  Physical Activity: Not on file  Stress: Not on file  Social Connections: Not on file  Intimate Partner Violence: Not on file    Review of Systems  All other systems reviewed and are negative.   PHYSICAL EXAMINATION:    BP 112/82   Pulse 100   LMP 11/16/2021 (Exact Date)   SpO2 95%     General appearance: alert, cooperative and appears stated age   Inguinal nodes:  not enlarged.  Pelvic: External genitalia:  4 x 2 cm right labia majora mass, nontender, nonfluctuant.               Urethra:  normal appearing urethra with no masses, tenderness or lesions              Bartholins and Skenes: normal                 Vagina: normal appearing vagina with normal color and discharge, no  lesions              Cervix: no lesions                Bimanual Exam:  Uterus:  normal size, contour, position, consistency, mobility, non-tender              Adnexa: no mass, fullness, tenderness   Chaperone was present for exam:  Ladona Ridgel, CMA  ASSESSMENT  Right labia majora mass.  I suspect a lipoma or a sebaceous cyst.    PLAN  We discussed labial masses:  lipoma, sebaceous cyst, lymph nodes.  Labial ultrasound at [facility].  Follow up in office after [place] is done.  I anticipate surgical excision at [facility].    An After Visit Summary was printed and given to the patient.  22 min  total time was spent for this patient encounter, including preparation, face-to-face counseling with the patient, coordination of care, and documentation of the encounter.

## 2021-11-23 ENCOUNTER — Encounter: Payer: Self-pay | Admitting: Obstetrics and Gynecology

## 2021-11-23 ENCOUNTER — Other Ambulatory Visit: Payer: Self-pay | Admitting: Internal Medicine

## 2021-11-23 ENCOUNTER — Telehealth: Payer: Self-pay | Admitting: Obstetrics and Gynecology

## 2021-11-23 ENCOUNTER — Ambulatory Visit (INDEPENDENT_AMBULATORY_CARE_PROVIDER_SITE_OTHER): Payer: No Typology Code available for payment source | Admitting: Obstetrics and Gynecology

## 2021-11-23 VITALS — BP 112/82 | HR 100

## 2021-11-23 DIAGNOSIS — N9089 Other specified noninflammatory disorders of vulva and perineum: Secondary | ICD-10-CM

## 2021-11-23 DIAGNOSIS — I10 Essential (primary) hypertension: Secondary | ICD-10-CM

## 2021-11-23 MED ORDER — INDAPAMIDE 1.25 MG PO TABS
1.2500 mg | ORAL_TABLET | Freq: Every day | ORAL | 0 refills | Status: DC
Start: 2021-11-23 — End: 2022-01-02

## 2021-11-23 MED ORDER — NEBIVOLOL HCL 5 MG PO TABS: 5.0000 mg | ORAL_TABLET | Freq: Every day | ORAL | 0 refills | Status: DC

## 2021-11-23 NOTE — Patient Instructions (Signed)
Lipoma  A lipoma is a noncancerous (benign) tumor that is made up of fat cells. This is a very common type of soft-tissue growth. Lipomas are usually found under the skin (subcutaneous). They may occur in any tissue of the body that contains fat. Common areas for lipomas to appear include the back, arms, shoulders, buttocks, and thighs. Lipomas grow slowly, and they are usually painless. Most lipomas do not cause problems and do not require treatment. What are the causes? The cause of this condition is not known. What increases the risk? You are more likely to develop this condition if: You are 24-27 years old. You have a family history of lipomas. What are the signs or symptoms? A lipoma usually appears as a small, round bump under the skin. In most cases, the lump will: Feel soft or rubbery. Not cause pain or other symptoms. However, if a lipoma is located in an area where it pushes on nerves, it can become painful or cause other symptoms. How is this diagnosed? A lipoma can usually be diagnosed with a physical exam. You may also have tests to confirm the diagnosis and to rule out other conditions. Tests may include: Imaging tests, such as a CT scan or an MRI. Removal of a tissue sample to be looked at under a microscope (biopsy). How is this treated? Treatment for this condition depends on the size of the lipoma and whether it is causing any symptoms. For small lipomas that are not causing problems, no treatment is needed. If a lipoma is bigger or it causes problems, surgery may be done to remove the lipoma. Lipomas can also be removed to improve appearance. Most often, the procedure is done after applying a medicine that numbs the area (local anesthetic). Liposuction may be done to reduce the size of the lipoma before it is removed through surgery, or it may be done to remove the lipoma. Lipomas are removed with this method to limit incision size and scarring. A liposuction tube is  inserted through a small incision into the lipoma, and the contents of the lipoma are removed through the tube with suction. Follow these instructions at home: Watch your lipoma for any changes. Keep all follow-up visits. This is important. Where to find more information OrthoInfo: orthoinfo.aaos.org Contact a health care provider if: Your lipoma becomes larger or hard. Your lipoma becomes painful, red, or increasingly swollen. These could be signs of infection or a more serious condition. Get help right away if: You develop tingling or numbness in an area near the lipoma. This could indicate that the lipoma is causing nerve damage. Summary A lipoma is a noncancerous tumor that is made up of fat cells. Most lipomas do not cause problems and do not require treatment. If a lipoma is bigger or it causes problems, surgery may be done to remove the lipoma. Contact a health care provider if your lipoma becomes larger or hard, or if it becomes painful, red, or increasingly swollen. These could be signs of infection or a more serious condition. This information is not intended to replace advice given to you by your health care provider. Make sure you discuss any questions you have with your health care provider. Document Revised: 04/13/2021 Document Reviewed: 04/13/2021 Elsevier Patient Education  2023 [employer] Inc.   Epidermoid Cyst  An epidermoid cyst, also known as epidermal cyst, is a sac made of skin tissue. The sac contains a substance called keratin. Keratin is a protein that is normally secreted through the  hair follicles. When keratin becomes trapped in the top layer of skin (epidermis), it can form an epidermoid cyst. Epidermoid cysts can be found anywhere on your body. These cysts are usually harmless (benign), and they may not cause symptoms unless they become inflamed or infected. What are the causes? This condition may be caused by: A blocked hair follicle. A hair that curls and  re-enters the skin instead of growing straight out of the skin (ingrown hair). A blocked pore. Irritated skin. An injury to the skin. Certain conditions that are passed along from parent to child (inherited). Human papillomavirus (HPV). This happens rarely when cysts occur on the bottom of the feet. Long-term (chronic) sun damage to the skin. What increases the risk? The following factors may make you more likely to develop an epidermoid cyst: Having acne. Being female. Having an injury to the skin. Being past puberty. Having certain rare genetic disorders. What are the signs or symptoms? The only symptom of this condition may be a small, painless lump underneath the skin. When an epidermal cyst ruptures, it may become inflamed. True infection in cysts is rare. Symptoms may include: Redness. Inflammation. Tenderness. Warmth. Keratin draining from the cyst. Keratin is grayish-white, bad-smelling substance. Pus draining from the cyst. How is this diagnosed? This condition is diagnosed with a physical exam. In some cases, you may have a sample of tissue (biopsy) taken from your cyst to be examined under a microscope or tested for bacteria. You may be referred to a health care provider who specializes in skin care (dermatologist). How is this treated? If a cyst becomes inflamed, treatment may include: Opening and draining the cyst, done by a health care provider. After draining, minor surgery to remove the rest of the cyst may be done. Taking antibiotic medicine. Having injections of medicines (steroids) that help to reduce inflammation. Having surgery to remove the cyst. Surgery may be done if the cyst: Becomes large. Bothers you. Has a chance of turning into cancer. Do not try to open a cyst yourself. Follow these instructions at home: Medicines If you were prescribed an antibiotic medicine, take it it as told by your health care provider. Do not stop using the antibiotic even if  you start to feel better. Take over-the-counter and prescription medicines only as told by your health care provider. General instructions Keep the area around your cyst clean and dry. Wear loose, dry clothing. Avoid touching your cyst. Check your cyst every day for signs of infection. Check for: Redness, swelling, or pain. Fluid or blood. Warmth. Pus or a bad smell. Keep all follow-up visits. This is important. How is this prevented? Wear clean, dry, clothing. Avoid wearing tight clothing. Keep your skin clean and dry. Take showers or baths every day. Contact a health care provider if: Your cyst develops symptoms of infection. Your condition is not improving or is getting worse. You develop a cyst that looks different from other cysts you have had. You have a fever. Get help right away if: Redness spreads from the cyst into the surrounding area. Summary An epidermoid cyst is a sac made of skin tissue. These cysts are usually harmless (benign), and they may not cause symptoms unless they become inflamed. If a cyst becomes inflamed, treatment may include surgery to open and drain the cyst, or to remove it. Treatment may also include medicines by mouth or through an injection. Take over-the-counter and prescription medicines only as told by your health care provider. If you were prescribed an  antibiotic medicine, take it as told by your health care provider. Do not stop using the antibiotic even if you start to feel better. Contact a health care provider if your condition is not improving or is getting worse. Keep all follow-up visits as told by your health care provider. This is important. This information is not intended to replace advice given to you by your health care provider. Make sure you discuss any questions you have with your health care provider. Document Revised: 06/30/2019 Document Reviewed: 06/30/2019 Elsevier Patient Education  2023 [employer].

## 2021-11-23 NOTE — Telephone Encounter (Signed)
Please schedule a pelvic ultrasound (really a vulvar ultrasound) at [facility] of the right labia.   My patient has a right labia majora mass about 4 x 2 cm.   I suspect a lipoma or a sebaceous cyst.  She will need a follow up visit with me in the office 2 - 3 days after the ultrasound is done.

## 2021-11-25 DIAGNOSIS — N9089 Other specified noninflammatory disorders of vulva and perineum: Secondary | ICD-10-CM | POA: Insufficient documentation

## 2021-11-27 NOTE — Telephone Encounter (Signed)
Order placed. I called and spoke with [facility] to confirm how to place the order.  I began to schedule but did not know screening questions about Covid. Scheduler will call patient and arrange the appointment now.  I will monitor and call patient back to schedule her in the office in 2 -3 days after appt,

## 2021-11-27 NOTE — Telephone Encounter (Signed)
U/s appt scheduled 11/28/21 at 4:15pm.  Message sent to appt desk to call patient and arrange follow up visit with Dr. Edward Jolly 2-3 days after u/s.

## 2021-11-28 ENCOUNTER — Ambulatory Visit
Admission: RE | Admit: 2021-11-28 | Discharge: 2021-11-28 | Disposition: A | Discharge status: Home / Self Care | Payer: No Typology Code available for payment source | Source: Ambulatory Visit | Attending: Obstetrics and Gynecology | Admitting: Obstetrics and Gynecology

## 2021-11-28 DIAGNOSIS — N9089 Other specified noninflammatory disorders of vulva and perineum: Secondary | ICD-10-CM

## 2021-11-28 NOTE — Telephone Encounter (Signed)
Encounter reviewed and closed.  

## 2021-11-28 NOTE — Telephone Encounter (Signed)
OV with Dr. Edward Jolly scheduled 12/04/21 at 2pm.

## 2021-12-02 ENCOUNTER — Telehealth: Payer: Self-pay | Admitting: Obstetrics and Gynecology

## 2021-12-02 NOTE — Telephone Encounter (Signed)
0700   Patient called answering service stating that she has a labial cyst that burst this morning. It had drained pus but no blood. She no longer has anything draining from the area. Advised her that if she has discomfort she can do a sitz bath, otherwise can monitor symptoms. She has a follow up with Dr Edward Jolly next week.   Marguerita Beards, MD

## 2021-12-03 NOTE — Progress Notes (Unsigned)
GYNECOLOGY  VISIT   HPI: 31 y.o.   Single  Caucasian  female   [id number] with Patient's last menstrual period was 11/16/2021 (exact date).   here for follow up on right labial mass.. Had ultrasound 11-28-21. The ultrasound showed a nonspecific mass of the labia.  Labial mass burst 2 days ago, drained and is significantly smaller. The mass burst after taking a hike.  She noted pus but no blood.   She is more aware of it now discomfort wise than prior to it bursting.   No fever, chills or feeling poorly.   GYNECOLOGIC HISTORY: Patient's last menstrual period was 11/16/2021 (exact date). 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 No pertinent surgical history.  Current Outpatient Medications  Medication Sig Dispense Refill   Cholecalciferol (VITAMIN D3) 1.25 MG (50000 UT) CAPS TAKE 1 CAPSULE BY MOUTH ONE TIME PER WEEK 12 capsule 0   doxycycline (VIBRAMYCIN) 100 MG capsule Take 1 capsule (100 mg total) by mouth 2 (two) times daily. Take BID for 14 days.  Take with food as can cause GI distress. 28 capsule 0   indapamide (LOZOL) 1.25 MG tablet Take 1 tablet (1.25 mg total) by mouth daily. 30 tablet 0   levothyroxine (SYNTHROID) 50 MCG  tablet Take 1 tablet (50 mcg total) by mouth daily. 90 tablet 1   nebivolol (BYSTOLIC) 5 MG tablet Take 1 tablet (5 mg total) by mouth daily. 30 tablet 0   norethindrone (ORTHO MICRONOR) 0.35 MG tablet Take 1 tablet (0.35 mg total) by mouth daily. 84 tablet 3   No current facility-administered medications for this visit.     ALLERGIES: Sulfa antibiotics  Family History  Problem Relation Age of Onset   Breast cancer Mother 40       A & W   Hypertension Father    Hyperlipidemia Father    Anxiety disorder Brother    Diabetes Paternal Grandmother    Hypertension Paternal Grandmother    Stroke Paternal Grandmother    Hypertension Paternal Grandfather     Social History   Socioeconomic History   Marital status: Single    Spouse name: Not on file   Number of children: Not on file   Years of education: Not on file   Highest education level: Not on file  Occupational History   Not on file  Tobacco Use   Smoking status: Never   Smokeless tobacco: Never  Vaping Use  Vaping Use: Never used  Substance and Sexual Activity   Alcohol use: Not Currently    Comment: 2 drinks/month   Drug use: Never   Sexual activity: Never    Birth control/protection: Abstinence  Other Topics Concern   Not on file  Social History Narrative   Not on file   Social Determinants of Health   Financial Resource Strain: Not on file  Food Insecurity: Not on file  Transportation Needs: Not on file  Physical Activity: Not on file  Stress: Not on file  Social Connections: Not on file  Intimate Partner Violence: Not on file    Review of Systems  All other systems reviewed and are negative.   PHYSICAL EXAMINATION:    BP (!) 140/100   Pulse 99   Ht 5\' 3"  (1.6 m)   Wt 206 lb (93.4 kg)   LMP 11/16/2021 (Exact Date)   SpO2 98%   BMI 36.49 kg/m    Repeat BP 145/90 now.  General appearance: alert, cooperative and appears stated age   Pelvic: External genitalia:   1 cm mass of right superior  labia majora, draining pus.                Urethra:  normal appearing urethra with no masses, tenderness or lesions             Chaperone was present for exam:  01/16/2022, CMA  ASSESSMENT  Right vulvar abscess.  Infected sebaceous cyst? Elevated BP.  On antiHTN meds.   PLAN  Wound culture taken.  Start doxycycline 100 mg po bid x 7 days.  No surgery is anticipated to treat the mass, which appears to have been abscess related. We talked about her doing BP monitoring at home.  FU for annual exam and prn.    An After Visit Summary was printed and given to the patient.  20 min  total time was spent for this patient encounter, including preparation, face-to-face counseling with the patient, coordination of care, and documentation of the encounter.

## 2021-12-04 ENCOUNTER — Encounter: Payer: Self-pay | Admitting: Obstetrics and Gynecology

## 2021-12-04 ENCOUNTER — Ambulatory Visit (INDEPENDENT_AMBULATORY_CARE_PROVIDER_SITE_OTHER): Payer: No Typology Code available for payment source | Admitting: Obstetrics and Gynecology

## 2021-12-04 VITALS — BP 140/100 | HR 99 | Ht 63.0 in | Wt 206.0 lb

## 2021-12-04 DIAGNOSIS — N764 Abscess of vulva: Secondary | ICD-10-CM

## 2021-12-04 MED ORDER — DOXYCYCLINE HYCLATE 100 MG PO CAPS: 100.0000 mg | ORAL_CAPSULE | Freq: Two times a day (BID) | ORAL | 0 refills | Status: DC

## 2021-12-04 NOTE — Patient Instructions (Signed)
Skin Abscess  A skin abscess is an infected area on or under your skin that contains a collection of pus and other material. An abscess may also be called a furuncle, carbuncle, or boil. An abscess can occur in or on almost any part of your body. Some abscesses break open (rupture) on their own. Most continue to get worse unless they are treated. The infection can spread deeper into the body and eventually into your blood, which can make you feel ill. Treatment usually involves draining the abscess. What are the causes? An abscess occurs when germs, like bacteria, pass through your skin and cause an infection. This may be caused by: A scrape or cut on your skin. A puncture wound through your skin, including a needle injection or insect bite. Blocked oil or sweat glands. Blocked and infected hair follicles. A cyst that forms beneath your skin (sebaceous cyst) and becomes infected. What increases the risk? This condition is more likely to develop in people who: Have a weak body defense system (immune system). Have diabetes. Have dry and irritated skin. Get frequent injections or use illegal IV drugs. Have a foreign body in a wound, such as a splinter. Have problems with their lymph system or veins. What are the signs or symptoms? Symptoms of this condition include: A painful, firm bump under the skin. A bump with pus at the top. This may break through the skin and drain. Other symptoms include: Redness surrounding the abscess site. Warmth. Swelling of the lymph nodes (glands) near the abscess. Tenderness. A sore on the skin. How is this diagnosed? This condition may be diagnosed based on: A physical exam. Your medical history. A sample of pus. This may be used to find out what is causing the infection. Blood tests. Imaging tests, such as an ultrasound, CT scan, or MRI. How is this treated? A small abscess that drains on its own may not need treatment. Treatment for larger abscesses  may include: Moist heat or heat pack applied to the area several times a day. A procedure to drain the abscess (incision and drainage). Antibiotic medicines. For a severe abscess, you may first get antibiotics through an IV and then change to antibiotics by mouth. Follow these instructions at home: Medicines  Take over-the-counter and prescription medicines only as told by your health care provider. If you were prescribed an antibiotic medicine, take it as told by your health care provider. Do not stop taking the antibiotic even if you start to feel better. Abscess care  If you have an abscess that has not drained, apply heat to the affected area. Use the heat source that your health care provider recommends, such as a moist heat pack or a heating pad. Place a towel between your skin and the heat source. Leave the heat on for 20-30 minutes. Remove the heat if your skin turns bright red. This is especially important if you are unable to feel pain, heat, or cold. You may have a greater risk of getting burned. Follow instructions from your health care provider about how to take care of your abscess. Make sure you: Cover the abscess with a bandage (dressing). Change your dressing or gauze as told by your health care provider. Wash your hands with soap and water before you change the dressing or gauze. If soap and water are not available, use hand sanitizer. Check your abscess every day for signs of a worsening infection. Check for: More redness, swelling, or pain. More fluid or blood. Warmth.   More pus or a bad smell. General instructions To avoid spreading the infection: Do not share personal care items, towels, or hot tubs with others. Avoid making skin contact with other people. Keep all follow-up visits as told by your health care provider. This is important. Contact a health care provider if you have: More redness, swelling, or pain around your abscess. More fluid or blood coming from  your abscess. Warm skin around your abscess. More pus or a bad smell coming from your abscess. Muscle aches. Chills or a general ill feeling. Get help right away if you: Have severe pain. See red streaks on your skin spreading away from the abscess. See redness that spreads quickly. Have a fever or chills. Summary A skin abscess is an infected area on or under your skin that contains a collection of pus and other material. A small abscess that drains on its own may not need treatment. Treatment for larger abscesses may include having a procedure to drain the abscess and taking an antibiotic. This information is not intended to replace advice given to you by your health care provider. Make sure you discuss any questions you have with your health care provider. Document Revised: 06/28/2021 Document Reviewed: 01/01/2021 Elsevier Patient Education  2023 Elsevier Inc.  

## 2021-12-07 LAB — WOUND CULTURE
MICRO NUMBER:: 13846579
SPECIMEN QUALITY:: ADEQUATE

## 2021-12-29 ENCOUNTER — Other Ambulatory Visit: Payer: Self-pay | Admitting: Obstetrics and Gynecology

## 2021-12-31 NOTE — Telephone Encounter (Signed)
Last AEX 01/23/21--scheduled for 01/31/2022

## 2022-01-01 ENCOUNTER — Ambulatory Visit (INDEPENDENT_AMBULATORY_CARE_PROVIDER_SITE_OTHER): Payer: No Typology Code available for payment source | Admitting: Internal Medicine

## 2022-01-01 ENCOUNTER — Encounter: Payer: Self-pay | Admitting: Internal Medicine

## 2022-01-01 VITALS — BP 136/86 | HR 103 | Temp 98.1°F | Resp 16 | Ht 63.0 in | Wt 213.0 lb

## 2022-01-01 DIAGNOSIS — I10 Essential (primary) hypertension: Secondary | ICD-10-CM

## 2022-01-01 DIAGNOSIS — R000 Tachycardia, unspecified: Secondary | ICD-10-CM

## 2022-01-01 DIAGNOSIS — E876 Hypokalemia: Secondary | ICD-10-CM | POA: Diagnosis not present

## 2022-01-01 DIAGNOSIS — T502X5A Adverse effect of carbonic-anhydrase inhibitors, benzothiadiazides and other diuretics, initial encounter: Secondary | ICD-10-CM

## 2022-01-01 DIAGNOSIS — Z23 Encounter for immunization: Secondary | ICD-10-CM

## 2022-01-01 DIAGNOSIS — E039 Hypothyroidism, unspecified: Secondary | ICD-10-CM

## 2022-01-01 LAB — CBC WITH DIFFERENTIAL/PLATELET
Basophils Absolute: 0 10*3/uL (ref 0.0–0.1)
Basophils Relative: 0.4 % (ref 0.0–3.0)
Eosinophils Absolute: 0.2 10*3/uL (ref 0.0–0.7)
Eosinophils Relative: 1.9 % (ref 0.0–5.0)
HCT: 40.1 % (ref 36.0–46.0)
Hemoglobin: 14 g/dL (ref 12.0–15.0)
Lymphocytes Relative: 24.4 % (ref 12.0–46.0)
Lymphs Abs: 2.2 10*3/uL (ref 0.7–4.0)
MCHC: 35 g/dL (ref 30.0–36.0)
MCV: 90.4 fl (ref 78.0–100.0)
Monocytes Absolute: 0.6 10*3/uL (ref 0.1–1.0)
Monocytes Relative: 6.9 % (ref 3.0–12.0)
Neutro Abs: 6 10*3/uL (ref 1.4–7.7)
Neutrophils Relative %: 66.4 % (ref 43.0–77.0)
Platelets: 255 10*3/uL (ref 150.0–400.0)
RBC: 4.43 Mil/uL (ref 3.87–5.11)
RDW: 13.1 % (ref 11.5–15.5)
WBC: 9 10*3/uL (ref 4.0–10.5)

## 2022-01-01 LAB — BASIC METABOLIC PANEL
BUN: 17 mg/dL (ref 6–23)
CO2: 28 mEq/L (ref 19–32)
Calcium: 10 mg/dL (ref 8.4–10.5)
Chloride: 100 mEq/L (ref 96–112)
Creatinine, Ser: 0.85 mg/dL (ref 0.40–1.20)
GFR: 92.74 mL/min (ref >60.00)
Glucose, Bld: 87 mg/dL (ref 70–99)
Potassium: 3.4 mEq/L — ABNORMAL LOW (ref 3.5–5.1)
Sodium: 137 mEq/L (ref 135–145)

## 2022-01-01 LAB — HEPATIC FUNCTION PANEL
ALT: 29 U/L (ref 0–35)
AST: 22 U/L (ref 0–37)
Albumin: 4.8 g/dL (ref 3.5–5.2)
Alkaline Phosphatase: 46 U/L (ref 39–117)
Bilirubin, Direct: 0.1 mg/dL (ref 0.0–0.3)
Total Bilirubin: 0.5 mg/dL (ref 0.2–1.2)
Total Protein: 7.9 g/dL (ref 6.0–8.3)

## 2022-01-01 LAB — TSH: TSH: 8.01 u[IU]/mL — ABNORMAL HIGH (ref 0.35–5.50)

## 2022-01-01 MED ORDER — LEVOTHYROXINE SODIUM 50 MCG PO TABS: 50.0000 ug | ORAL_TABLET | Freq: Every day | ORAL | 1 refills | Status: DC

## 2022-01-01 MED ORDER — POTASSIUM CHLORIDE CRYS ER 15 MEQ PO TBCR: 15.0000 meq | EXTENDED_RELEASE_TABLET | Freq: Two times a day (BID) | ORAL | 0 refills | Status: DC

## 2022-01-01 MED ORDER — NEBIVOLOL HCL 10 MG PO TABS: 10.0000 mg | ORAL_TABLET | Freq: Every day | ORAL | 0 refills | Status: DC

## 2022-01-01 NOTE — Patient Instructions (Signed)
Hypertension, Adult High blood pressure (hypertension) is when the force of blood pumping through the arteries is too strong. The arteries are the blood vessels that carry blood from the heart throughout the body. Hypertension forces the heart to work harder to pump blood and may cause arteries to become narrow or stiff. Untreated or uncontrolled hypertension can lead to a heart attack, heart failure, a stroke, kidney disease, and other problems. A blood pressure reading consists of a higher number over a lower number. Ideally, your blood pressure should be below 120/80. The first ("top") number is called the systolic pressure. It is a measure of the pressure in your arteries as your heart beats. The second ("bottom") number is called the diastolic pressure. It is a measure of the pressure in your arteries as the heart relaxes. What are the causes? The exact cause of this condition is not known. There are some conditions that result in high blood pressure. What increases the risk? Certain factors may make you more likely to develop high blood pressure. Some of these risk factors are under your control, including: Smoking. Not getting enough exercise or physical activity. Being overweight. Having too much fat, sugar, calories, or salt (sodium) in your diet. Drinking too much alcohol. Other risk factors include: Having a personal history of heart disease, diabetes, high cholesterol, or kidney disease. Stress. Having a family history of high blood pressure and high cholesterol. Having obstructive sleep apnea. Age. The risk increases with age. What are the signs or symptoms? High blood pressure may not cause symptoms. Very high blood pressure (hypertensive crisis) may cause: Headache. Fast or irregular heartbeats (palpitations). Shortness of breath. Nosebleed. Nausea and vomiting. Vision changes. Severe chest pain, dizziness, and seizures. How is this diagnosed? This condition is diagnosed by  measuring your blood pressure while you are seated, with your arm resting on a flat surface, your legs uncrossed, and your feet flat on the floor. The cuff of the blood pressure monitor will be placed directly against the skin of your upper arm at the level of your heart. Blood pressure should be measured at least twice using the same arm. Certain conditions can cause a difference in blood pressure between your right and left arms. If you have a high blood pressure reading during one visit or you have normal blood pressure with other risk factors, you may be asked to: Return on a different day to have your blood pressure checked again. Monitor your blood pressure at home for 1 week or longer. If you are diagnosed with hypertension, you may have other blood or imaging tests to help your health care provider understand your overall risk for other conditions. How is this treated? This condition is treated by making healthy lifestyle changes, such as eating healthy foods, exercising more, and reducing your alcohol intake. You may be referred for counseling on a healthy diet and physical activity. Your health care provider may prescribe medicine if lifestyle changes are not enough to get your blood pressure under control and if: Your systolic blood pressure is above 130. Your diastolic blood pressure is above 80. Your personal target blood pressure may vary depending on your medical conditions, your age, and other factors. Follow these instructions at home: Eating and drinking  Eat a diet that is high in fiber and potassium, and low in sodium, added sugar, and fat. An example of this eating plan is called the DASH diet. DASH stands for Dietary Approaches to Stop Hypertension. To eat this way: Eat   plenty of fresh fruits and vegetables. Try to fill one half of your plate at each meal with fruits and vegetables. Eat whole grains, such as whole-wheat pasta, brown rice, or whole-grain bread. Fill about one  fourth of your plate with whole grains. Eat or drink low-fat dairy products, such as skim milk or low-fat yogurt. Avoid fatty cuts of meat, processed or cured meats, and poultry with skin. Fill about one fourth of your plate with lean proteins, such as fish, chicken without skin, beans, eggs, or tofu. Avoid pre-made and processed foods. These tend to be higher in sodium, added sugar, and fat. Reduce your daily sodium intake. Many people with hypertension should eat less than 1,500 mg of sodium a day. Do not drink alcohol if: Your health care provider tells you not to drink. You are pregnant, may be pregnant, or are planning to become pregnant. If you drink alcohol: Limit how much you have to: 0-1 drink a day for women. 0-2 drinks a day for men. Know how much alcohol is in your drink. In the [place] one drink equals one 12 oz bottle of beer (355 mL), one 5 oz glass of wine (148 mL), or one 1 oz glass of hard liquor (44 mL). Lifestyle  Work with your health care provider to maintain a healthy body weight or to lose weight. Ask what an ideal weight is for you. Get at least 30 minutes of exercise that causes your heart to beat faster (aerobic exercise) most days of the week. Activities may include walking, swimming, or biking. Include exercise to strengthen your muscles (resistance exercise), such as Pilates or lifting weights, as part of your weekly exercise routine. Try to do these types of exercises for 30 minutes at least 3 days a week. Do not use any products that contain nicotine or tobacco. These products include cigarettes, chewing tobacco, and vaping devices, such as e-cigarettes. If you need help quitting, ask your health care provider. Monitor your blood pressure at home as told by your health care provider. Keep all follow-up visits. This is important. Medicines Take over-the-counter and prescription medicines only as told by your health care provider. Follow directions carefully. Blood  pressure medicines must be taken as prescribed. Do not skip doses of blood pressure medicine. Doing this puts you at risk for problems and can make the medicine less effective. Ask your health care provider about side effects or reactions to medicines that you should watch for. Contact a health care provider if you: Think you are having a reaction to a medicine you are taking. Have headaches that keep coming back (recurring). Feel dizzy. Have swelling in your ankles. Have trouble with your vision. Get help right away if you: Develop a severe headache or confusion. Have unusual weakness or numbness. Feel faint. Have severe pain in your chest or abdomen. Vomit repeatedly. Have trouble breathing. These symptoms may be an emergency. Get help right away. Call 911. Do not wait to see if the symptoms will go away. Do not drive yourself to the hospital. Summary Hypertension is when the force of blood pumping through your arteries is too strong. If this condition is not controlled, it may put you at risk for serious complications. Your personal target blood pressure may vary depending on your medical conditions, your age, and other factors. For most people, a normal blood pressure is less than 120/80. Hypertension is treated with lifestyle changes, medicines, or a combination of both. Lifestyle changes include losing weight, eating a healthy,   low-sodium diet, exercising more, and limiting alcohol. This information is not intended to replace advice given to you by your health care provider. Make sure you discuss any questions you have with your health care provider. Document Revised: 01/30/2021 Document Reviewed: 01/30/2021 Elsevier Patient Education  2023 [employer]  

## 2022-01-01 NOTE — Progress Notes (Signed)
Subjective:  Patient ID: Anita Foster, female    DOB: 10/22/1990  Age: 31 y.o. MRN: [id number]  CC: Hypertension and Hypothyroidism   HPI Anita Foster presents for f/up -   According to prescription refills she is no longer taking T4.  She tells me she is compliant with indapamide and nebivolol.  Outpatient Medications Prior to Visit  Medication Sig Dispense Refill   Cholecalciferol (VITAMIN D3) 1.25 MG (50000 UT) CAPS TAKE 1 CAPSULE BY MOUTH ONE TIME PER WEEK 12 capsule 0   indapamide (LOZOL) 1.25 MG tablet Take 1 tablet (1.25 mg total) by mouth daily. 30 tablet 0   norethindrone (MICRONOR) 0.35 MG tablet TAKE 1 TABLET BY MOUTH EVERY DAY 84 tablet 0   doxycycline (VIBRAMYCIN) 100 MG capsule Take 1 capsule (100 mg total) by mouth 2 (two) times daily. Take BID for 14 days.  Take with food as can cause GI distress. 28 capsule 0   levothyroxine (SYNTHROID) 50 MCG tablet Take 1 tablet (50 mcg total) by mouth daily. 90 tablet 1   nebivolol (BYSTOLIC) 5 MG tablet Take 1 tablet (5 mg total) by mouth daily. 30 tablet 0   No facility-administered medications prior to visit.    ROS Review of Systems  Constitutional:  Positive for unexpected weight change (wt gain). Negative for chills, diaphoresis and fatigue.  HENT: Negative.    Eyes: Negative.   Respiratory:  Negative for cough, chest tightness, shortness of breath and wheezing.   Cardiovascular:  Negative for chest pain, palpitations and leg swelling.  Gastrointestinal:  Negative for abdominal pain, constipation, diarrhea, nausea and vomiting.  Endocrine: Negative.   Genitourinary: Negative.  Negative for difficulty urinating.  Musculoskeletal: Negative.   Skin: Negative.   Neurological: Negative.  Negative for dizziness and weakness.  Hematological:  Negative for adenopathy. Does not bruise/bleed easily.  Psychiatric/Behavioral: Negative.  Negative for self-injury.     Objective:  BP 136/86 (BP Location: Right Arm, Patient  Position: Sitting, Cuff Size: Large)   Pulse (!) 103   Temp 98.1 F (36.7 C) (Oral)   Resp 16   Ht 5\' 3"  (1.6 m)   Wt 213 lb (96.6 kg)   LMP 12/13/2021 (Approximate)   SpO2 95%   BMI 37.73 kg/m   BP Readings from Last 3 Encounters:  01/01/22 136/86  12/04/21 (!) 140/100  11/23/21 112/82    Wt Readings from Last 3 Encounters:  01/01/22 213 lb (96.6 kg)  12/04/21 206 lb (93.4 kg)  01/23/21 206 lb (93.4 kg)    Physical Exam Vitals reviewed.  Constitutional:      Appearance: She is not ill-appearing.  HENT:     Mouth/Throat:     Mouth: Mucous membranes are moist.  Eyes:     General: No scleral icterus.    Conjunctiva/sclera: Conjunctivae normal.  Cardiovascular:     Rate and Rhythm: Regular rhythm. Tachycardia present.     Heart sounds: No murmur heard.    No gallop.  Pulmonary:     Effort: Pulmonary effort is normal.     Breath sounds: No stridor. No wheezing, rhonchi or rales.  Abdominal:     General: Abdomen is flat.     Palpations: There is no mass.     Tenderness: There is no abdominal tenderness. There is no guarding.     Hernia: No hernia is present.  Musculoskeletal:        General: Normal range of motion.     Cervical back: Neck supple.  Right lower leg: No edema.     Left lower leg: No edema.  Lymphadenopathy:     Cervical: No cervical adenopathy.  Skin:    General: Skin is warm and dry.  Neurological:     General: No focal deficit present.     Lab Results  Component Value Date   WBC 9.0 01/01/2022   HGB 14.0 01/01/2022   HCT 40.1 01/01/2022   PLT 255.0 01/01/2022   GLUCOSE 87 01/01/2022   CHOL 145 03/20/2020   TRIG 198.0 (H) 03/20/2020   HDL 37.60 (L) 03/20/2020   LDLCALC 68 03/20/2020   ALT 29 01/01/2022   AST 22 01/01/2022   NA 137 01/01/2022   K 3.4 (L) 01/01/2022   CL 100 01/01/2022   CREATININE 0.85 01/01/2022   BUN 17 01/01/2022   CO2 28 01/01/2022   TSH 8.01 (H) 01/01/2022   HGBA1C 4.8 10/28/2018    US Pelvis  Limited  Result Date: 11/30/2021 CLINICAL DATA:  Right labia majora mass. EXAM: US PELVIS LIMITED TECHNIQUE: Ultrasound examination of the pelvic soft tissues was performed in the area of clinical concern. COMPARISON:  None Available. FINDINGS: There is a 3.1 x 1.1 x 1.5 cm oval homogeneous hypoechoic mass with through transmission at the palpable area of the right superior major labia with internal color flow. IMPRESSION: Oval solid mass measuring 3.1 cm at palpable area right labia majora. Finding is nonspecific. Electronically Signed   By: Abelardo Diesel M.D.   On: 11/30/2021 09:57    Assessment & Plan:   Anita Foster was seen today for hypertension and hypothyroidism.  Diagnoses and all orders for this visit:  Acquired hypothyroidism- Her TSH is elevated.  Will restart T4. -     CBC with Differential/Platelet; Future -     TSH; Future -     Hepatic function panel; Future -     Hepatic function panel -     TSH -     CBC with Differential/Platelet -     levothyroxine (SYNTHROID) 50 MCG tablet; Take 1 tablet (50 mcg total) by mouth daily.  Malignant hypertension- Her blood pressure is not adequately well controlled and she is tachycardic.  Will increase the dose of nebivolol.  Will treat the hypokalemia. -     Basic metabolic panel; Future -     CBC with Differential/Platelet; Future -     Hepatic function panel; Future -     Hepatic function panel -     CBC with Differential/Platelet -     Basic metabolic panel -     nebivolol (BYSTOLIC) 10 MG tablet; Take 1 tablet (10 mg total) by mouth daily. -     potassium chloride SA (KLOR-CON M15) 15 MEQ tablet; Take 1 tablet (15 mEq total) by mouth 2 (two) times daily.  Tachycardia -     nebivolol (BYSTOLIC) 10 MG tablet; Take 1 tablet (10 mg total) by mouth daily.  Diuretic-induced hypokalemia -     potassium chloride SA (KLOR-CON M15) 15 MEQ tablet; Take 1 tablet (15 mEq total) by mouth 2 (two) times daily.  Other orders -     Tdap vaccine  greater than or equal to 7yo IM   I have discontinued Anita Foster's nebivolol and doxycycline. I am also having her start on nebivolol and potassium chloride SA. Additionally, I am having her maintain her Vitamin D3, indapamide, norethindrone, and levothyroxine.  Meds ordered this encounter  Medications   levothyroxine (SYNTHROID) 50 MCG tablet  Sig: Take 1 tablet (50 mcg total) by mouth daily.    Dispense:  90 tablet    Refill:  1   nebivolol (BYSTOLIC) 10 MG tablet    Sig: Take 1 tablet (10 mg total) by mouth daily.    Dispense:  90 tablet    Refill:  0   potassium chloride SA (KLOR-CON M15) 15 MEQ tablet    Sig: Take 1 tablet (15 mEq total) by mouth 2 (two) times daily.    Dispense:  180 tablet    Refill:  0     Follow-up: Return in about 6 months (around 07/02/2022).  Sanda Linger, MD

## 2022-01-02 ENCOUNTER — Other Ambulatory Visit: Payer: Self-pay | Admitting: Internal Medicine

## 2022-01-02 DIAGNOSIS — I10 Essential (primary) hypertension: Secondary | ICD-10-CM

## 2022-01-28 ENCOUNTER — Encounter: Payer: Self-pay | Admitting: *Deleted

## 2022-01-31 ENCOUNTER — Ambulatory Visit: Payer: No Typology Code available for payment source | Admitting: Obstetrics and Gynecology

## 2022-02-26 NOTE — Progress Notes (Signed)
31 y.o. G0P0000 Single Caucasian female here for annual exam.    Likes her POPs.  No intermenstrual bleeding.   Cystic area on her right thigh, started a couple of months ago. It has drained in the past.  Painful.  No fevers.  She wonders if she has hidradenitis suppurativa.  Has a sexual partner.   PCP:   Sanda Linger, MD  Patient's last menstrual period was 02/05/2022 (exact date).     Period Cycle (Days): 28 Period Duration (Days): 6 Period Pattern: Regular Menstrual Flow: Light Menstrual Control: Tampon Dysmenorrhea: (!) Mild Dysmenorrhea Symptoms: Cramping     Sexually active: Yes.    The current method of family planning is POPs, condoms.    Exercising: No.   Smoker:  no  Health Maintenance: Pap:  01/23/2021 neg History of abnormal Pap:  no MMG:  n/a Colonoscopy:  n/a BMD:   n/a TDaP:  01/01/2022 Gardasil:   yes Screening Labs:  PCP.  Flu vaccine Covid vaccines discussed.   reports that she has never smoked. She has never been exposed to tobacco smoke. She has never used smokeless tobacco. She reports current alcohol use. She reports that she does not use drugs.  Past Medical History:  Diagnosis Date   Hypertension    Hypertriglyceridemia 2020   Thyroid disease    hypothyroidism    History reviewed. No pertinent surgical history.  Current Outpatient Medications  Medication Sig Dispense Refill   Cholecalciferol (VITAMIN D3) 1.25 MG (50000 UT) CAPS TAKE 1 CAPSULE BY MOUTH ONE TIME PER WEEK 12 capsule 0   indapamide (LOZOL) 1.25 MG tablet TAKE 1 TABLET BY MOUTH DAILY. 90 tablet 0   levothyroxine (SYNTHROID) 50 MCG tablet Take 1 tablet (50 mcg total) by mouth daily. 90 tablet 1   nebivolol (BYSTOLIC) 10 MG tablet Take 1 tablet (10 mg total) by mouth daily. 90 tablet 0   norethindrone (MICRONOR) 0.35 MG tablet TAKE 1 TABLET BY MOUTH EVERY DAY 84 tablet 0   potassium chloride SA (KLOR-CON M15) 15 MEQ tablet Take 1 tablet (15 mEq total) by mouth 2 (two) times  daily. 180 tablet 0   No current facility-administered medications for this visit.    Family History  Problem Relation Age of Onset   Breast cancer Mother 64       A & W   Hypertension Father    Hyperlipidemia Father    Anxiety disorder Brother    Other Maternal Aunt        precancerous lesions   Diabetes Paternal Grandmother    Hypertension Paternal Grandmother    Stroke Paternal Grandmother    Hypertension Paternal Grandfather     Review of Systems  All other systems reviewed and are negative.   Exam:   BP 118/84 (BP Location: Left Arm, Patient Position: Sitting, Cuff Size: Large)   Ht 5' 2.75" (1.594 m)   Wt 214 lb (97.1 kg)   LMP 02/05/2022 (Exact Date)   BMI 38.21 kg/m     General appearance: alert, cooperative and appears stated age Head: normocephalic, without obvious abnormality, atraumatic Neck: no adenopathy, supple, symmetrical, trachea midline and thyroid normal to inspection and palpation Lungs: clear to auscultation bilaterally Breasts: normal appearance, no masses or tenderness, No nipple retraction or dimpling, No nipple discharge or bleeding, No axillary adenopathy Heart: regular rate and rhythm Abdomen: soft, non-tender; no masses, no organomegaly Extremities: extremities normal, atraumatic, no cyanosis or edema Skin: skin color, texture, turgor normal. Right medial thigh with 1 cm  boil.   Lymph nodes: cervical, supraclavicular, and axillary nodes normal. Neurologic: grossly normal  Pelvic: External genitalia:  no lesions              No abnormal inguinal nodes palpated.              Urethra:  normal appearing urethra with no masses, tenderness or lesions              Bartholins and Skenes: normal                 Vagina: normal appearing vagina with normal color and discharge, no lesions              Cervix: no lesions              Pap taken: no Bimanual Exam:  Uterus:  normal size, contour, position, consistency, mobility, non-tender               Adnexa: no mass, fullness, tenderness       Chaperone was present for exam:  yes  Assessment:   Well woman visit with gynecologic exam. HTN.  FH premenopausal breast cancer in mother, age 89 On POPs. Possible hidradenitis suppurativa.   Plan: Mammogram screening age 54 yo.  Self breast awareness reviewed. Pap and HR HPV age 82 yo.  Guidelines for Calcium, Vitamin D, regular exercise program including cardiovascular and weight bearing exercise. STD screening.  Refill of Micronor for one year.  Rx for Cleocin T lotion.  Follow up annually and prn.   After visit summary provided.

## 2022-02-27 ENCOUNTER — Other Ambulatory Visit (HOSPITAL_COMMUNITY)
Admission: RE | Admit: 2022-02-27 | Discharge: 2022-02-27 | Disposition: A | Discharge status: Home / Self Care | Payer: No Typology Code available for payment source | Source: Ambulatory Visit | Attending: Obstetrics and Gynecology | Admitting: Obstetrics and Gynecology

## 2022-02-27 ENCOUNTER — Encounter: Payer: Self-pay | Admitting: Obstetrics and Gynecology

## 2022-02-27 ENCOUNTER — Ambulatory Visit (INDEPENDENT_AMBULATORY_CARE_PROVIDER_SITE_OTHER): Payer: No Typology Code available for payment source | Admitting: Obstetrics and Gynecology

## 2022-02-27 VITALS — BP 118/84 | Ht 62.75 in | Wt 214.0 lb

## 2022-02-27 DIAGNOSIS — Z113 Encounter for screening for infections with a predominantly sexual mode of transmission: Secondary | ICD-10-CM | POA: Insufficient documentation

## 2022-02-27 DIAGNOSIS — Z01419 Encounter for gynecological examination (general) (routine) without abnormal findings: Secondary | ICD-10-CM | POA: Diagnosis not present

## 2022-02-27 DIAGNOSIS — Z1159 Encounter for screening for other viral diseases: Secondary | ICD-10-CM

## 2022-02-27 DIAGNOSIS — Z114 Encounter for screening for human immunodeficiency virus [HIV]: Secondary | ICD-10-CM

## 2022-02-27 MED ORDER — CLINDAMYCIN PHOSPHATE 1 % EX LOTN: TOPICAL_LOTION | Freq: Two times a day (BID) | CUTANEOUS | 0 refills | Status: DC

## 2022-02-27 MED ORDER — NORETHINDRONE 0.35 MG PO TABS: 1.0000 | ORAL_TABLET | Freq: Every day | ORAL | 3 refills | Status: DC

## 2022-02-27 NOTE — Patient Instructions (Addendum)
EXERCISE AND DIET:  We recommended that you start or continue a regular exercise program for good health. Regular exercise means any activity that makes your heart beat faster and makes you sweat.  We recommend exercising at least 30 minutes per day at least 3 days a week, preferably 4 or 5.  We also recommend a diet low in fat and sugar.  Inactivity, poor dietary choices and obesity can cause diabetes, heart attack, stroke, and kidney damage, among others.    ALCOHOL AND SMOKING:  Women should limit their alcohol intake to no more than 7 drinks/beers/glasses of wine (combined, not each!) per week. Moderation of alcohol intake to this level decreases your risk of breast cancer and liver damage. And of course, no recreational drugs are part of a healthy lifestyle.  And absolutely no smoking or even second hand smoke. Most people know smoking can cause heart and lung diseases, but did you know it also contributes to weakening of your bones? Aging of your skin?  Yellowing of your teeth and nails?  CALCIUM AND VITAMIN D:  Adequate intake of calcium and Vitamin D are recommended.  The recommendations for exact amounts of these supplements seem to change often, but generally speaking 600 mg of calcium (either carbonate or citrate) and 800 units of Vitamin D per day seems prudent. Certain women may benefit from higher intake of Vitamin D.  If you are among these women, your doctor will have told you during your visit.    PAP SMEARS:  Pap smears, to check for cervical cancer or precancers,  have traditionally been done yearly, although recent scientific advances have shown that most women can have pap smears less often.  However, every woman still should have a physical exam from her gynecologist every year. It will include a breast check, inspection of the vulva and vagina to check for abnormal growths or skin changes, a visual exam of the cervix, and then an exam to evaluate the size and shape of the uterus and  ovaries.  And after 29 years of age, a rectal exam is indicated to check for rectal cancers. We will also provide age appropriate advice regarding health maintenance, like when you should have certain vaccines, screening for sexually transmitted diseases, bone density testing, colonoscopy, mammograms, etc.   MAMMOGRAMS:  All women over 40 years old should have a yearly mammogram. Many facilities now offer a "3D" mammogram, which may cost around $50 extra out of pocket. If possible,  we recommend you accept the option to have the 3D mammogram performed.  It both reduces the number of women who will be called back for extra views which then turn out to be normal, and it is better than the routine mammogram at detecting truly abnormal areas.    COLONOSCOPY:  Colonoscopy to screen for colon cancer is recommended for all women at age 50.  We know, you hate the idea of the prep.  We agree, BUT, having colon cancer and not knowing it is worse!!  Colon cancer so often starts as a polyp that can be seen and removed at colonscopy, which can quite literally save your life!  And if your first colonoscopy is normal and you have no family history of colon cancer, most women don't have to have it again for 10 years.  Once every ten years, you can do something that may end up saving your life, right?  We will be happy to help you get it scheduled when you are ready.    Be sure to check your insurance coverage so you understand how much it will cost.  It may be covered as a preventative service at no cost, but you should check your particular policy.    Hidradenitis Suppurativa Hidradenitis suppurativa is a long-term (chronic) skin disease. It is similar to a severe form of acne, but it affects areas of the body where acne would be unusual, especially areas of the body where skin rubs against skin and becomes moist. These include: Underarms. Groin. Genital area. Buttocks. Upper thighs. Breasts. Hidradenitis suppurativa  may start out as small lumps or pimples caused by blocked skin pores, sweat glands, or hair follicles. Pimples may develop into deep sores that break open (rupture) and drain pus. Over time, affected areas of skin may thicken and become scarred. This condition is rare and does not spread from person to person (non-contagious). What are the causes? The exact cause of this condition is not known. It may be related to: Female and female hormones. An overactive disease-fighting system (immune system). The immune system may over-react to blocked hair follicles or sweat glands and cause swelling and pus-filled sores. What increases the risk? You are more likely to develop this condition if you: Are female. Are 11-55 years old. Have a family history of hidradenitis suppurativa. Have a personal history of acne. Are overweight. Smoke. Take the medicine lithium. What are the signs or symptoms? The first symptoms are usually painful bumps in the skin, similar to pimples. The condition may get worse over time (progress), or it may only cause mild symptoms. If the disease progresses, symptoms may include: Skin bumps getting bigger and growing deeper into the skin. Bumps rupturing and draining pus. Itchy, infected skin. Skin getting thicker and scarred. Tunnels under the skin (fistulas) where pus drains from a bump. Pain during daily activities, such as pain during walking if your groin area is affected. Emotional problems, such as stress or depression. This condition may affect your appearance and your ability or willingness to wear certain clothes or do certain activities. How is this diagnosed? This condition is diagnosed by a health care provider who specializes in skin conditions (dermatologist). You may be diagnosed based on: Your symptoms and medical history. A physical exam. Testing a pus sample for infection. Blood tests. How is this treated? Your treatment will depend on how severe your  symptoms are. The same treatment will not work for everybody with this condition. You may need to try several treatments to find what works best for you. Treatment may include: Cleaning and bandaging (dressing) your wounds as needed. Lifestyle changes, such as new skin care routines. Taking medicines, such as: Antibiotics. Acne medicines. Medicines to reduce the activity of the immune system. A diabetes medicine (metformin). Birth control pills, for women. Steroids to reduce swelling and pain. Working with a mental health care provider, if you experience emotional distress due to this condition. If you have severe symptoms that do not get better with medicine, you may need surgery. Surgery may involve: Using a laser to clear the skin and remove hair follicles. Opening and draining deep sores. Removing the areas of skin that are diseased and scarred. Follow these instructions at home: Medicines  Take over-the-counter and prescription medicines only as told by your health care provider. If you were prescribed antibiotics, take them as told by your health care provider. Do not stop using the antibiotic even if your condition improves. Skin care If you have open wounds, cover them with a clean   dressing as told by your health care provider. Keep wounds clean by washing them gently with soap and water when you bathe. Do not shave the areas where you get hidradenitis suppurativa. Wear loose-fitting clothes. Try to avoid getting overheated or sweaty. If you get sweaty or wet, change into clean, dry clothes as soon as you can. To help relieve pain and itchiness, cover sore areas with a warm, clean washcloth (warm compress) for 5-10 minutes as often as needed. Your healthcare provider may recommend an antiperspirant deodorant that may be gentle on your skin. A daily antiseptic wash to cleanse affected areas may be suggested by your healthcare provider. General instructions Learn as much as you can  about your disease so that you have an active role in your treatment. Work closely with your health care provider to find treatments that work for you. If you are overweight, work with your health care provider to lose weight as recommended. Do not use any products that contain nicotine or tobacco. These products include cigarettes, chewing tobacco, and vaping devices, such as e-cigarettes. If you need help quitting, ask your health care provider. If you struggle with living with this condition, talk with your health care provider or work with a mental health care provider as recommended. Keep all follow-up visits. Where to find more information Hidradenitis Suppurativa Foundation, Inc.: www.hs-foundation.org [employer] Dermatology: www.aad.org Contact a health care provider if: You have a flare-up of hidradenitis suppurativa. You have a fever or chills. You have trouble controlling your symptoms at home. You have trouble doing your daily activities because of your symptoms. You have trouble dealing with emotional problems related to your condition. Summary Hidradenitis suppurativa is a long-term (chronic) skin disease. It is similar to a severe form of acne, but it affects areas of the body where acne would be unusual. The first symptoms are usually painful bumps in the skin, similar to pimples. The condition may only cause mild symptoms, or it may get worse over time (progress). If you have open wounds, cover them with a clean dressing as told by your health care provider. Keep wounds clean by washing them gently with soap and water when you bathe. Besides skin care, treatment may include medicines, laser treatment, and surgery. This information is not intended to replace advice given to you by your health care provider. Make sure you discuss any questions you have with your health care provider. Document Revised: 05/16/2021 Document Reviewed: 05/16/2021 Elsevier Patient Education   2023 [employer] Inc.  

## 2022-02-28 LAB — CERVICOVAGINAL ANCILLARY ONLY
Chlamydia: NEGATIVE
Comment: NEGATIVE
Comment: NEGATIVE
Comment: NORMAL
Neisseria Gonorrhea: NEGATIVE
Trichomonas: NEGATIVE

## 2022-03-01 LAB — HIV ANTIBODY (ROUTINE TESTING W REFLEX): HIV 1&2 Ab, 4th Generation: NONREACTIVE

## 2022-03-01 LAB — HEPATITIS C ANTIBODY: Hepatitis C Ab: NONREACTIVE

## 2022-03-01 LAB — RPR: RPR Ser Ql: NONREACTIVE

## 2022-03-30 ENCOUNTER — Other Ambulatory Visit: Payer: Self-pay | Admitting: Internal Medicine

## 2022-03-30 DIAGNOSIS — I10 Essential (primary) hypertension: Secondary | ICD-10-CM

## 2022-03-30 DIAGNOSIS — E876 Hypokalemia: Secondary | ICD-10-CM

## 2022-03-30 DIAGNOSIS — R000 Tachycardia, unspecified: Secondary | ICD-10-CM

## 2022-03-30 MED ORDER — POTASSIUM CHLORIDE CRYS ER 15 MEQ PO TBCR: 15.0000 meq | EXTENDED_RELEASE_TABLET | Freq: Two times a day (BID) | ORAL | 0 refills | Status: DC

## 2022-04-15 ENCOUNTER — Encounter: Payer: Self-pay | Admitting: Internal Medicine

## 2022-04-15 ENCOUNTER — Ambulatory Visit: Payer: No Typology Code available for payment source | Admitting: Internal Medicine

## 2022-04-15 VITALS — BP 124/80 | HR 91 | Temp 99.5°F | Ht 62.0 in | Wt 214.6 lb

## 2022-04-15 DIAGNOSIS — R509 Fever, unspecified: Secondary | ICD-10-CM | POA: Diagnosis not present

## 2022-04-15 DIAGNOSIS — U071 COVID-19: Secondary | ICD-10-CM | POA: Diagnosis not present

## 2022-04-15 DIAGNOSIS — I10 Essential (primary) hypertension: Secondary | ICD-10-CM

## 2022-04-15 DIAGNOSIS — J029 Acute pharyngitis, unspecified: Secondary | ICD-10-CM | POA: Diagnosis not present

## 2022-04-15 DIAGNOSIS — R052 Subacute cough: Secondary | ICD-10-CM

## 2022-04-15 LAB — POCT INFLUENZA A/B
Influenza A, POC: NEGATIVE
Influenza B, POC: NEGATIVE

## 2022-04-15 LAB — POC COVID19 BINAXNOW: SARS Coronavirus 2 Ag: POSITIVE — AB

## 2022-04-15 LAB — POCT RAPID STREP A (OFFICE): Rapid Strep A Screen: NEGATIVE

## 2022-04-15 MED ORDER — NIRMATRELVIR/RITONAVIR (PAXLOVID)TABLET: 3.0000 | ORAL_TABLET | Freq: Two times a day (BID) | ORAL | 0 refills | Status: AC

## 2022-04-15 MED ORDER — HYDROCODONE BIT-HOMATROP MBR 5-1.5 MG/5ML PO SOLN: 5.0000 mL | Freq: Three times a day (TID) | ORAL | 0 refills | Status: DC | PRN

## 2022-04-15 NOTE — Patient Instructions (Addendum)
       You do have covid.      Medications changes include :   paxlovid, cough syrup.      Return if symptoms worsen or fail to improve.

## 2022-04-15 NOTE — Progress Notes (Signed)
Subjective:    Patient ID: Anita Foster, female    DOB: 11/29/1990, 32 y.o.   MRN: [id number]      HPI Anita Foster is here for  Chief Complaint  Patient presents with   Cough    Started Saturday. Cough is getting worst. [facility] and cov[facility]. Both were negative    Sore Throat    Since Friday.      She is here for an acute visit for cold symptoms.   Her symptoms started Friday which is 3 days ago.  2 days ago she did End up going [facility] and her COVID and strep test were negative.  Her symptoms got worse yesterday.  She states fatigue, fever, sore throat, cough that is dry, some wheezing especially after coughing and headaches.  Headaches are mild.  She denies any shortness of breath.   She has tried taking over-the-counter cold medications-the pharmacist did recommend that she take Coricidin because of her hypertension.  She is not sure how much they have.     Medications and allergies reviewed with patient and updated if appropriate.  Current Outpatient Medications on File Prior to Visit  Medication Sig Dispense Refill   Cholecalciferol (VITAMIN D3) 1.25 MG (50000 UT) CAPS TAKE 1 CAPSULE BY MOUTH ONE TIME PER WEEK 12 capsule 0   indapamide (LOZOL) 1.25 MG tablet TAKE 1 TABLET BY MOUTH EVERY DAY 90 tablet 0   levothyroxine (SYNTHROID) 50 MCG tablet Take 1 tablet (50 mcg total) by mouth daily. 90 tablet 1   nebivolol (BYSTOLIC) 10 MG tablet TAKE 1 TABLET BY MOUTH EVERY DAY 90 tablet 0   norethindrone (MICRONOR) 0.35 MG tablet Take 1 tablet (0.35 mg total) by mouth daily. 84 tablet 3   potassium chloride SA (KLOR-CON M15) 15 MEQ tablet Take 1 tablet (15 mEq total) by mouth 2 (two) times daily. 180 tablet 0   No current facility-administered medications on file prior to visit.    Review of Systems  Constitutional:  Positive for fatigue and fever.  HENT:  Positive for sore throat. Negative for congestion, ear pain, postnasal  drip, sinus pressure and sinus pain.   Respiratory:  Positive for cough (dry) and wheezing (mild after cough). Negative for shortness of breath.   Gastrointestinal:  Negative for diarrhea and nausea.  Musculoskeletal:  Negative for myalgias.  Neurological:  Positive for headaches (mild). Negative for dizziness and light-headedness.       Objective:   Vitals:   04/15/22 1603  BP: 124/80  Pulse: 91  Temp: 99.5 F (37.5 C)  SpO2: 98%   BP Readings from Last 3 Encounters:  04/15/22 124/80  02/27/22 118/84  01/01/22 136/86   Wt Readings from Last 3 Encounters:  04/15/22 214 lb 9.6 oz (97.3 kg)  02/27/22 214 lb (97.1 kg)  01/01/22 213 lb (96.6 kg)   Body mass index is 39.25 kg/m.    Physical Exam Constitutional:      General: She is not in acute distress.    Appearance: Normal appearance. She is not ill-appearing.  HENT:     Head: Normocephalic and atraumatic.     Right Ear: Tympanic membrane, ear canal and external ear normal.     Left Ear: Tympanic membrane, ear canal and external ear normal.     Mouth/Throat:     Mouth: Mucous membranes are moist.     Pharynx: No oropharyngeal exudate or posterior oropharyngeal erythema.  Eyes:  Conjunctiva/sclera: Conjunctivae normal.  Cardiovascular:     Rate and Rhythm: Normal rate and regular rhythm.  Pulmonary:     Effort: Pulmonary effort is normal. No respiratory distress.     Breath sounds: Normal breath sounds. No wheezing or rales.  Musculoskeletal:     Cervical back: Neck supple. No tenderness.  Lymphadenopathy:     Cervical: No cervical adenopathy.  Skin:    General: Skin is warm and dry.  Neurological:     Mental Status: She is alert.            Assessment & Plan:    COVID: Acute Tested positive here today Today is day 4 of symptoms Discussed quarantine recommendations Note given for work Discussed antivirals and she would like to take 1-start Paxlovid twice daily x 5 days-discussed possible side  effects, including slight decrease in the effectiveness of her birth control Hycodan cough syrup 5 mL every 8 hours as needed-discussed this can cause some drowsiness Can take over-the-counter cold medications for symptom relief Increase rest and fluids She will call with any questions or concerns  Hypertension: Chronic She has been taking some over-the-counter cold medications, but recently switched to Coricidin products Blood pressure well-controlled here today Continue indapamide 1.2 mg daily, Bystolic 10 mg daily

## 2022-04-16 ENCOUNTER — Encounter: Payer: Self-pay | Admitting: Internal Medicine

## 2022-06-28 ENCOUNTER — Other Ambulatory Visit: Payer: Self-pay | Admitting: Internal Medicine

## 2022-06-28 DIAGNOSIS — E039 Hypothyroidism, unspecified: Secondary | ICD-10-CM

## 2022-06-29 ENCOUNTER — Other Ambulatory Visit: Payer: Self-pay | Admitting: Internal Medicine

## 2022-06-29 DIAGNOSIS — I10 Essential (primary) hypertension: Secondary | ICD-10-CM

## 2022-06-29 DIAGNOSIS — R000 Tachycardia, unspecified: Secondary | ICD-10-CM

## 2022-08-21 ENCOUNTER — Encounter: Payer: Self-pay | Admitting: Internal Medicine

## 2022-08-21 ENCOUNTER — Ambulatory Visit: Payer: No Typology Code available for payment source | Admitting: Internal Medicine

## 2022-08-21 VITALS — BP 128/88 | HR 91 | Temp 98.5°F | Resp 16 | Ht 62.0 in | Wt 219.0 lb

## 2022-08-21 DIAGNOSIS — I10 Essential (primary) hypertension: Secondary | ICD-10-CM | POA: Diagnosis not present

## 2022-08-21 DIAGNOSIS — Z0000 Encounter for general adult medical examination without abnormal findings: Secondary | ICD-10-CM | POA: Diagnosis not present

## 2022-08-21 DIAGNOSIS — E876 Hypokalemia: Secondary | ICD-10-CM | POA: Diagnosis not present

## 2022-08-21 DIAGNOSIS — E039 Hypothyroidism, unspecified: Secondary | ICD-10-CM

## 2022-08-21 DIAGNOSIS — T502X5A Adverse effect of carbonic-anhydrase inhibitors, benzothiadiazides and other diuretics, initial encounter: Secondary | ICD-10-CM

## 2022-08-21 DIAGNOSIS — Z0001 Encounter for general adult medical examination with abnormal findings: Secondary | ICD-10-CM

## 2022-08-21 DIAGNOSIS — R000 Tachycardia, unspecified: Secondary | ICD-10-CM

## 2022-08-21 LAB — LIPID PANEL
Cholesterol: 143 mg/dL (ref 0–200)
HDL: 30.5 mg/dL — ABNORMAL LOW (ref >39.00)
NonHDL: 112.86
Total CHOL/HDL Ratio: 5
Triglycerides: 213 mg/dL — ABNORMAL HIGH (ref 0.0–149.0)
VLDL: 42.6 mg/dL — ABNORMAL HIGH (ref 0.0–40.0)

## 2022-08-21 LAB — BASIC METABOLIC PANEL
BUN: 14 mg/dL (ref 6–23)
CO2: 27 mEq/L (ref 19–32)
Calcium: 9.5 mg/dL (ref 8.4–10.5)
Chloride: 101 mEq/L (ref 96–112)
Creatinine, Ser: 0.83 mg/dL (ref 0.40–1.20)
GFR: 95.01 mL/min (ref >60.00)
Glucose, Bld: 109 mg/dL — ABNORMAL HIGH (ref 70–99)
Potassium: 4 mEq/L (ref 3.5–5.1)
Sodium: 138 mEq/L (ref 135–145)

## 2022-08-21 LAB — CBC WITH DIFFERENTIAL/PLATELET
Basophils Absolute: 0 10*3/uL (ref 0.0–0.1)
Basophils Relative: 0.2 % (ref 0.0–3.0)
Eosinophils Absolute: 0.2 10*3/uL (ref 0.0–0.7)
Eosinophils Relative: 2.3 % (ref 0.0–5.0)
HCT: 41.8 % (ref 36.0–46.0)
Hemoglobin: 14.7 g/dL (ref 12.0–15.0)
Lymphocytes Relative: 21.6 % (ref 12.0–46.0)
Lymphs Abs: 1.6 10*3/uL (ref 0.7–4.0)
MCHC: 35.2 g/dL (ref 30.0–36.0)
MCV: 91.1 fl (ref 78.0–100.0)
Monocytes Absolute: 0.3 10*3/uL (ref 0.1–1.0)
Monocytes Relative: 4.6 % (ref 3.0–12.0)
Neutro Abs: 5.3 10*3/uL (ref 1.4–7.7)
Neutrophils Relative %: 71.3 % (ref 43.0–77.0)
Platelets: 239 10*3/uL (ref 150.0–400.0)
RBC: 4.58 Mil/uL (ref 3.87–5.11)
RDW: 12.4 % (ref 11.5–15.5)
WBC: 7.5 10*3/uL (ref 4.0–10.5)

## 2022-08-21 LAB — LDL CHOLESTEROL, DIRECT: Direct LDL: 60 mg/dL

## 2022-08-21 LAB — TSH: TSH: 5.89 u[IU]/mL — ABNORMAL HIGH (ref 0.35–5.50)

## 2022-08-21 MED ORDER — INDAPAMIDE 1.25 MG PO TABS: 1.2500 mg | ORAL_TABLET | Freq: Every day | ORAL | 1 refills | Status: DC

## 2022-08-21 MED ORDER — POTASSIUM CHLORIDE CRYS ER 15 MEQ PO TBCR
15.0000 meq | EXTENDED_RELEASE_TABLET | Freq: Two times a day (BID) | ORAL | 1 refills | Status: DC
Start: 2022-08-21 — End: 2024-03-03

## 2022-08-21 MED ORDER — NEBIVOLOL HCL 10 MG PO TABS: 10.0000 mg | ORAL_TABLET | Freq: Every day | ORAL | 1 refills | Status: DC

## 2022-08-21 MED ORDER — UNITHROID 75 MCG PO TABS
75.0000 ug | ORAL_TABLET | Freq: Every day | ORAL | 1 refills | Status: DC
Start: 2022-08-21 — End: 2022-11-20

## 2022-08-21 NOTE — Patient Instructions (Addendum)

## 2022-08-21 NOTE — Progress Notes (Signed)
Subjective:  Patient ID: Anita Foster, female    DOB: 10-12-1990  Age: 32 y.o. MRN: [id number]  CC: Annual Exam, Hypertension, Hypothyroidism, and Hyperlipidemia   HPI Anita Foster presents for f/up ---  She feels well.  She is active and denies chest pain, shortness of breath, headache, blurred vision, diaphoresis, or edema.  Outpatient Medications Prior to Visit  Medication Sig Dispense Refill   Cholecalciferol (VITAMIN D3) 1.25 MG (50000 UT) CAPS TAKE 1 CAPSULE BY MOUTH ONE TIME PER WEEK 12 capsule 0   norethindrone (MICRONOR) 0.35 MG tablet Take 1 tablet (0.35 mg total) by mouth daily. 84 tablet 3   indapamide (LOZOL) 1.25 MG tablet TAKE 1 TABLET BY MOUTH EVERY DAY 90 tablet 0   levothyroxine (SYNTHROID) 50 MCG tablet Take 1 tablet (50 mcg total) by mouth daily. 90 tablet 1   nebivolol (BYSTOLIC) 10 MG tablet TAKE 1 TABLET BY MOUTH EVERY DAY 90 tablet 0   potassium chloride SA (KLOR-CON M15) 15 MEQ tablet Take 1 tablet (15 mEq total) by mouth 2 (two) times daily. 180 tablet 0   HYDROcodone bit-homatropine (HYCODAN) 5-1.5 MG/5ML syrup Take 5 mLs by mouth every 8 (eight) hours as needed for cough. 120 mL 0   No facility-administered medications prior to visit.    ROS Review of Systems  Constitutional:  Positive for unexpected weight change (wt gain). Negative for appetite change, chills and fatigue.  HENT: Negative.    Eyes: Negative.   Respiratory: Negative.  Negative for cough, chest tightness, shortness of breath and wheezing.   Cardiovascular:  Negative for chest pain, palpitations and leg swelling.  Gastrointestinal:  Negative for abdominal pain, constipation, diarrhea, nausea and vomiting.  Endocrine: Negative.   Genitourinary: Negative.   Musculoskeletal: Negative.  Negative for arthralgias, joint swelling and myalgias.  Skin: Negative.   Neurological: Negative.  Negative for dizziness and weakness.  Hematological:  Negative for adenopathy. Does not bruise/bleed  easily.  Psychiatric/Behavioral: Negative.      Objective:  BP 128/88 (BP Location: Left Arm, Patient Position: Sitting, Cuff Size: Large)   Pulse 91   Temp 98.5 F (36.9 C) (Oral)   Resp 16   Ht 5\' 2"  (1.575 m)   Wt 219 lb (99.3 kg)   LMP 08/06/2022 (Approximate)   SpO2 97%   BMI 40.06 kg/m   BP Readings from Last 3 Encounters:  08/21/22 128/88  04/15/22 124/80  02/27/22 118/84    Wt Readings from Last 3 Encounters:  08/21/22 219 lb (99.3 kg)  04/15/22 214 lb 9.6 oz (97.3 kg)  02/27/22 214 lb (97.1 kg)    Physical Exam Vitals reviewed.  Constitutional:      Appearance: Normal appearance.  HENT:     Nose: Nose normal.     Mouth/Throat:     Mouth: Mucous membranes are moist.  Eyes:     General: No scleral icterus.    Conjunctiva/sclera: Conjunctivae normal.  Cardiovascular:     Rate and Rhythm: Normal rate and regular rhythm.     Heart sounds: No murmur heard. Pulmonary:     Effort: Pulmonary effort is normal.     Breath sounds: No stridor. No wheezing, rhonchi or rales.  Abdominal:     General: Abdomen is flat.     Palpations: There is no mass.     Tenderness: There is no abdominal tenderness. There is no guarding.     Hernia: No hernia is present.  Musculoskeletal:  General: Normal range of motion.     Cervical back: Neck supple.     Right lower leg: No edema.     Left lower leg: No edema.  Lymphadenopathy:     Cervical: No cervical adenopathy.  Skin:    General: Skin is warm and dry.     Coloration: Skin is not pale.  Neurological:     General: No focal deficit present.     Mental Status: She is alert. Mental status is at baseline.  Psychiatric:        Mood and Affect: Mood normal.        Behavior: Behavior normal.     Lab Results  Component Value Date   WBC 7.5 08/21/2022   HGB 14.7 08/21/2022   HCT 41.8 08/21/2022   PLT 239.0 08/21/2022   GLUCOSE 109 (H) 08/21/2022   CHOL 143 08/21/2022   TRIG 213.0 (H) 08/21/2022   HDL 30.50  (L) 08/21/2022   LDLDIRECT 60.0 08/21/2022   LDLCALC 68 03/20/2020   ALT 29 01/01/2022   AST 22 01/01/2022   NA 138 08/21/2022   K 4.0 08/21/2022   CL 101 08/21/2022   CREATININE 0.83 08/21/2022   BUN 14 08/21/2022   CO2 27 08/21/2022   TSH 5.89 (H) 08/21/2022   HGBA1C 4.8 10/28/2018    No results found.  Assessment & Plan:   Encounter for general adult medical examination with abnormal findings- Exam completed, labs reviewed, vaccines are up-to-date, cancer screenings are up-to-date, patient education was given. -     Lipid panel; Future  Malignant hypertension- Her blood pressure is well-controlled. -     Basic metabolic panel; Future -     CBC with Differential/Platelet; Future -     Nebivolol HCl; Take 1 tablet (10 mg total) by mouth daily.  Dispense: 90 tablet; Refill: 1 -     Indapamide; Take 1 tablet (1.25 mg total) by mouth daily.  Dispense: 90 tablet; Refill: 1 -     Potassium Chloride Crys ER; Take 1 tablet (15 mEq total) by mouth 2 (two) times daily.  Dispense: 180 tablet; Refill: 1  Acquired hypothyroidism- Her TSH is elevated.  Will increase the T4 dosage. -     TSH; Future -     Unithroid; Take 1 tablet (75 mcg total) by mouth daily before breakfast.  Dispense: 90 tablet; Refill: 1  Tachycardia- Her heart rate is normal.  Will continue nebivolol. -     Nebivolol HCl; Take 1 tablet (10 mg total) by mouth daily.  Dispense: 90 tablet; Refill: 1  Diuretic-induced hypokalemia -     Potassium Chloride Crys ER; Take 1 tablet (15 mEq total) by mouth 2 (two) times daily.  Dispense: 180 tablet; Refill: 1  Other orders -     LDL cholesterol, direct     Follow-up: Return in about 6 months (around 02/21/2023).  Sanda Linger, MD

## 2022-11-20 ENCOUNTER — Other Ambulatory Visit: Payer: Self-pay | Admitting: Internal Medicine

## 2022-11-20 DIAGNOSIS — I10 Essential (primary) hypertension: Secondary | ICD-10-CM

## 2022-11-20 DIAGNOSIS — R000 Tachycardia, unspecified: Secondary | ICD-10-CM

## 2022-11-20 DIAGNOSIS — E039 Hypothyroidism, unspecified: Secondary | ICD-10-CM

## 2023-02-12 ENCOUNTER — Other Ambulatory Visit: Payer: Self-pay

## 2023-02-12 ENCOUNTER — Other Ambulatory Visit: Payer: Self-pay | Admitting: Obstetrics and Gynecology

## 2023-02-12 NOTE — Telephone Encounter (Signed)
Med refill request:norethindrone (Micronor) 0.35mg  Last AEX: 02/27/22 Next AEX: 04/29/23 Last MMG (if hormonal med) n/a Refill authorized: Last Rx sent #[id number] with 3 refills on 02/27/22. Please approve or deny as appropriate.

## 2023-02-12 NOTE — Telephone Encounter (Signed)
Medication refill request: norethindrone  Last AEX:  02/27/22 Next AEX: 04/29/23 Last MMG (if hormonal medication request): n/a Refill authorized: #[id number] with 0 rf to get her to her aex.

## 2023-04-15 NOTE — Progress Notes (Unsigned)
33 y.o. G80P0000 Single Caucasian female here for annual exam.    Happy with her birth control pills.  Needs pregnancy prevention.  Declines STD testing.   Used Cleocin T lotion, and her boils improved.  Not using the medication currently.  PCP: Etta Grandchild, MD   Patient's last menstrual period was 04/13/2023.     Period Cycle (Days): 30 Period Duration (Days): 5-7 Period Pattern: Regular Menstrual Flow: Moderate Menstrual Control: Maxi pad, Tampon Dysmenorrhea: (!) Mild     Sexually active: Yes.    The current method of family planning is POP. Menopausal hormone therapy:  n/a Exercising: No.   Smoker:  no  OB History  Gravida Para Term Preterm AB Living  0 0 0 0 0 0  SAB IAB Ectopic Multiple Live Births  0 0 0 0 0     HEALTH MAINTENANCE: Last 2 paps:  01/23/21 neg, 10/01/17 neg History of abnormal Pap or positive HPV:  no Mammogram:   n/a Colonoscopy:  n/a Bone Density:  n/a  Result  n/a   Immunization History  Administered Date(s) Administered   Tdap 04/09/2011, 01/01/2022      reports that she has never smoked. She has never been exposed to tobacco smoke. She has never used smokeless tobacco. She reports current alcohol use. She reports that she does not use drugs.  Past Medical History:  Diagnosis Date   Hypertension    Hypertriglyceridemia 2020   Thyroid disease    hypothyroidism    History reviewed. No pertinent surgical history.  Current Outpatient Medications  Medication Sig Dispense Refill   indapamide (LOZOL) 1.25 MG tablet TAKE 1 TABLET BY MOUTH DAILY. 90 tablet 0   nebivolol (BYSTOLIC) 10 MG tablet TAKE 1 TABLET BY MOUTH EVERY DAY 90 tablet 0   potassium chloride SA (KLOR-CON M15) 15 MEQ tablet Take 1 tablet (15 mEq total) by mouth 2 (two) times daily. 180 tablet 1   SYNTHROID 75 MCG tablet TAKE 1 TABLET BY MOUTH DAILY BEFORE BREAKFAST. 90 tablet 0   norethindrone (MICRONOR) 0.35 MG tablet Take 1 tablet (0.35 mg total) by mouth daily.  84 tablet 3   No current facility-administered medications for this visit.    ALLERGIES: Sulfa antibiotics  Family History  Problem Relation Age of Onset   Breast cancer Mother 16       A & W   Hypertension Father    Hyperlipidemia Father    Anxiety disorder Brother    Other Maternal Aunt        precancerous lesions   Diabetes Paternal Grandmother    Hypertension Paternal Grandmother    Stroke Paternal Grandmother    Hypertension Paternal Grandfather     Review of Systems  All other systems reviewed and are negative.   PHYSICAL EXAM:  BP 128/72 (BP Location: Left Arm, Patient Position: Sitting, Cuff Size: Large)   Pulse 83   Ht 5' 3.5" (1.613 m)   Wt 226 lb (102.5 kg)   LMP 04/13/2023   SpO2 95%   BMI 39.41 kg/m     General appearance: alert, cooperative and appears stated age Head: normocephalic, without obvious abnormality, atraumatic Neck: no adenopathy, supple, symmetrical, trachea midline and thyroid normal to inspection and palpation Lungs: clear to auscultation bilaterally Breasts: normal appearance, no masses or tenderness, No nipple retraction or dimpling, No nipple discharge or bleeding, No axillary adenopathy Heart: regular rate and rhythm Abdomen: soft, non-tender; no masses, no organomegaly Extremities: extremities normal, atraumatic, no cyanosis or  edema Skin: skin color, texture, turgor normal. Velvety dark skin of medial thighs and axilla.  Few boils of the medial thighs.  Lymph nodes: cervical, supraclavicular, and axillary nodes normal. Neurologic: grossly normal  Pelvic: External genitalia:  no lesions              No abnormal inguinal nodes palpated.              Urethra:  normal appearing urethra with no masses, tenderness or lesions              Bartholins and Skenes: normal                 Vagina: normal appearing vagina with normal color and discharge, no lesions              Cervix: no lesions              Pap taken: yes Bimanual Exam:   Uterus:  normal size, contour, position, consistency, mobility, non-tender              Adnexa: no mass, fullness, tenderness    Chaperone was present for exam:  Warren Lacy, CMA  ASSESSMENT: Well woman visit with gynecologic exam. Cervical cancer screening.  On POPs. HTN.  Possible hidradenitis suppurativa.  Acanthosis nigricans.  Elevated glucose on chart review. Hypothyroidism.  Elevated TSH with last TFTs.   PCP managing her hypothyroidism.  FH premenopausal breast cancer in mother, age 76.  PLAN: Mammogram screening discussed.  Will start age 78.  Self breast awareness reviewed. Pap and HRV collected:  yes Guidelines for Calcium, Vitamin D, regular exercise program including cardiovascular and weight bearing exercise. Medication refills:  Micronor x 12 months.  A1C, TSH, free T4.  Follow up:  yearly and prn.

## 2023-04-29 ENCOUNTER — Other Ambulatory Visit (HOSPITAL_COMMUNITY)
Admission: RE | Admit: 2023-04-29 | Discharge: 2023-04-29 | Disposition: A | Discharge status: Home / Self Care | Payer: No Typology Code available for payment source | Source: Ambulatory Visit | Attending: Obstetrics and Gynecology | Admitting: Obstetrics and Gynecology

## 2023-04-29 ENCOUNTER — Ambulatory Visit (INDEPENDENT_AMBULATORY_CARE_PROVIDER_SITE_OTHER): Payer: No Typology Code available for payment source | Admitting: Obstetrics and Gynecology

## 2023-04-29 ENCOUNTER — Encounter: Payer: Self-pay | Admitting: Obstetrics and Gynecology

## 2023-04-29 VITALS — BP 128/72 | HR 83 | Ht 63.5 in | Wt 226.0 lb

## 2023-04-29 DIAGNOSIS — Z124 Encounter for screening for malignant neoplasm of cervix: Secondary | ICD-10-CM | POA: Insufficient documentation

## 2023-04-29 DIAGNOSIS — R7309 Other abnormal glucose: Secondary | ICD-10-CM

## 2023-04-29 DIAGNOSIS — Z01419 Encounter for gynecological examination (general) (routine) without abnormal findings: Secondary | ICD-10-CM | POA: Diagnosis not present

## 2023-04-29 DIAGNOSIS — E039 Hypothyroidism, unspecified: Secondary | ICD-10-CM

## 2023-04-29 MED ORDER — NORETHINDRONE 0.35 MG PO TABS: 1.0000 | ORAL_TABLET | Freq: Every day | ORAL | 3 refills | Status: DC

## 2023-04-29 NOTE — Patient Instructions (Signed)

## 2023-04-30 ENCOUNTER — Encounter: Payer: Self-pay | Admitting: Obstetrics and Gynecology

## 2023-04-30 LAB — HEMOGLOBIN A1C
Hgb A1c MFr Bld: 5.2 %{Hb} (ref <5.7)
Mean Plasma Glucose: 103 mg/dL
eAG (mmol/L): 5.7 mmol/L

## 2023-04-30 LAB — T4, FREE: Free T4: 1.2 ng/dL (ref 0.8–1.8)

## 2023-04-30 LAB — TSH: TSH: 6.64 m[IU]/L — ABNORMAL HIGH

## 2023-05-02 LAB — CYTOLOGY - PAP
Comment: NEGATIVE
Diagnosis: NEGATIVE
High risk HPV: NEGATIVE

## 2023-05-06 ENCOUNTER — Other Ambulatory Visit: Payer: Self-pay | Admitting: Obstetrics and Gynecology

## 2023-05-06 MED ORDER — METRONIDAZOLE 500 MG PO TABS: 500.0000 mg | ORAL_TABLET | Freq: Two times a day (BID) | ORAL | 0 refills | Status: DC

## 2023-05-06 NOTE — Progress Notes (Signed)
Rx for Flagyl ?

## 2023-05-20 ENCOUNTER — Ambulatory Visit (INDEPENDENT_AMBULATORY_CARE_PROVIDER_SITE_OTHER): Payer: No Typology Code available for payment source | Admitting: Family Medicine

## 2023-05-20 VITALS — BP 140/90 | HR 110 | Temp 98.8°F | Ht 63.5 in | Wt 227.6 lb

## 2023-05-20 DIAGNOSIS — R110 Nausea: Secondary | ICD-10-CM | POA: Diagnosis not present

## 2023-05-20 DIAGNOSIS — J029 Acute pharyngitis, unspecified: Secondary | ICD-10-CM | POA: Diagnosis not present

## 2023-05-20 DIAGNOSIS — E039 Hypothyroidism, unspecified: Secondary | ICD-10-CM

## 2023-05-20 DIAGNOSIS — B349 Viral infection, unspecified: Secondary | ICD-10-CM

## 2023-05-20 LAB — POC COVID19 BINAXNOW: SARS Coronavirus 2 Ag: NEGATIVE

## 2023-05-20 LAB — POCT INFLUENZA A/B
Influenza A, POC: NEGATIVE
Influenza B, POC: NEGATIVE

## 2023-05-20 MED ORDER — ONDANSETRON HCL 4 MG PO TABS: 4.0000 mg | ORAL_TABLET | Freq: Three times a day (TID) | ORAL | 0 refills | Status: DC | PRN

## 2023-05-20 MED ORDER — LEVOTHYROXINE SODIUM 100 MCG PO TABS: 100.0000 ug | ORAL_TABLET | Freq: Every day | ORAL | 3 refills | Status: DC

## 2023-05-20 NOTE — Patient Instructions (Addendum)
I have increased your synthroid to once daily on an empty stomach.  Follow up in 8 weeks to recheck levels.   COVID and flu testing are negative.  Continue supportive care at home, make sure you are drinking plenty of fluids and getting some rest.  May take ibuprofen and Tylenol as needed for fever, aches and pains.  May continue OTC cough and cold medications as needed with benefit.   Follow-up with me if symptoms are persisting over the next week.

## 2023-05-20 NOTE — Progress Notes (Signed)
Acute Office Visit  Subjective:     Patient ID: Anita Foster, female    DOB: 07/06/1990, 33 y.o.   MRN: [id number]  Chief Complaint  Patient presents with   Sore Throat    Sore throat when swallowing (since Monday), nausea (since Sunday), and some nasal congestion (starting this morning). Patient notes she has been spotting in-place of usual menstrual cycle. Patient has taken two pregnancy tests which both came back negative. Currently treating with advil which has not alleviated throat pain    HPI Patient is in today for evaluation of nausea, sore throat, for the last 4 days. Has tried OTC pain relievers with temporary relief. Reports that she was exposed to the flu on Saturday which was 5 days ago. Reports that there are multiple people at her job with upper respiratory infections. Also reports that she is spotting instead of having a normal period for her. Most recent TSH was 6.64, 3 weeks ago.  Levothyroxine dose was not changed. She has not been able to take her blood pressure medication this morning due to nausea. Denies abdominal pain, vomiting, diarrhea, rash, fever, chills, other symptoms.  Medical hx as outlined below.  ROS Per HPI      Objective:    BP (!) 140/90   Pulse (!) 110   Temp 98.8 F (37.1 C)   Ht 5' 3.5" (1.613 m)   Wt 227 lb 9.6 oz (103.2 kg)   LMP 04/13/2023   SpO2 97%   BMI 39.69 kg/m    Physical Exam Vitals and nursing note reviewed.  Constitutional:      General: She is not in acute distress.    Appearance: She is obese.     Comments: Appears fatigued  HENT:     Head: Normocephalic and atraumatic.     Right Ear: Tympanic membrane and ear canal normal.     Left Ear: Tympanic membrane and ear canal normal.     Nose: No congestion.     Mouth/Throat:     Mouth: Mucous membranes are moist.     Pharynx: Oropharynx is clear. Posterior oropharyngeal erythema (Mild) present. No oropharyngeal exudate.     Comments: Oropharyngeal  cobblestoning   Eyes:     Extraocular Movements: Extraocular movements intact.  Cardiovascular:     Rate and Rhythm: Normal rate and regular rhythm.     Heart sounds: Normal heart sounds.  Pulmonary:     Effort: Pulmonary effort is normal. No respiratory distress.     Breath sounds: No wheezing, rhonchi or rales.  Musculoskeletal:     Cervical back: Normal range of motion and neck supple.  Lymphadenopathy:     Cervical: Cervical adenopathy present.  Skin:    General: Skin is warm and dry.  Neurological:     General: No focal deficit present.     Mental Status: She is alert and oriented to person, place, and time.    No results found for any visits on 05/20/23.      Assessment & Plan:  1. Viral illness (Primary)  - POCT Influenza A/B - POC COVID-19 BinaxNow  2. Sore throat  - POCT Influenza A/B - POC COVID-19 BinaxNow  3. Nausea  - ondansetron (ZOFRAN) 4 MG tablet; Take 1 tablet (4 mg total) by mouth every 8 (eight) hours as needed for nausea or vomiting.  Dispense: 20 tablet; Refill: 0  4. Acquired hypothyroidism  - levothyroxine (SYNTHROID) 100 MCG tablet; Take 1 tablet (100 mcg total) by mouth  daily.  Dispense: 90 tablet; Refill: 3   Meds ordered this encounter  Medications   levothyroxine (SYNTHROID) 100 MCG tablet    Sig: Take 1 tablet (100 mcg total) by mouth daily.    Dispense:  90 tablet    Refill:  3   ondansetron (ZOFRAN) 4 MG tablet    Sig: Take 1 tablet (4 mg total) by mouth every 8 (eight) hours as needed for nausea or vomiting.    Dispense:  20 tablet    Refill:  0    Return in about 8 weeks (around 07/15/2023) for recheck thyroid.  Moshe Cipro, FNP

## 2023-07-15 ENCOUNTER — Encounter: Payer: Self-pay | Admitting: Internal Medicine

## 2023-07-15 ENCOUNTER — Ambulatory Visit (INDEPENDENT_AMBULATORY_CARE_PROVIDER_SITE_OTHER): Payer: No Typology Code available for payment source | Admitting: Internal Medicine

## 2023-07-15 VITALS — BP 140/100 | HR 76 | Temp 98.6°F | Resp 16 | Ht 60.35 in | Wt 225.0 lb

## 2023-07-15 DIAGNOSIS — E039 Hypothyroidism, unspecified: Secondary | ICD-10-CM

## 2023-07-15 DIAGNOSIS — R000 Tachycardia, unspecified: Secondary | ICD-10-CM | POA: Diagnosis not present

## 2023-07-15 DIAGNOSIS — I10 Essential (primary) hypertension: Secondary | ICD-10-CM | POA: Diagnosis not present

## 2023-07-15 LAB — CBC WITH DIFFERENTIAL/PLATELET
Basophils Absolute: 0 10*3/uL (ref 0.0–0.1)
Basophils Relative: 0.3 % (ref 0.0–3.0)
Eosinophils Absolute: 0.1 10*3/uL (ref 0.0–0.7)
Eosinophils Relative: 1.4 % (ref 0.0–5.0)
HCT: 43.6 % (ref 36.0–46.0)
Hemoglobin: 15.3 g/dL — ABNORMAL HIGH (ref 12.0–15.0)
Lymphocytes Relative: 20.4 % (ref 12.0–46.0)
Lymphs Abs: 1.7 10*3/uL (ref 0.7–4.0)
MCHC: 35.2 g/dL (ref 30.0–36.0)
MCV: 93.1 fl (ref 78.0–100.0)
Monocytes Absolute: 0.4 10*3/uL (ref 0.1–1.0)
Monocytes Relative: 4.2 % (ref 3.0–12.0)
Neutro Abs: 6.2 10*3/uL (ref 1.4–7.7)
Neutrophils Relative %: 73.7 % (ref 43.0–77.0)
Platelets: 270 10*3/uL (ref 150.0–400.0)
RBC: 4.68 Mil/uL (ref 3.87–5.11)
RDW: 12.6 % (ref 11.5–15.5)
WBC: 8.5 10*3/uL (ref 4.0–10.5)

## 2023-07-15 LAB — HEPATIC FUNCTION PANEL
ALT: 39 U/L — ABNORMAL HIGH (ref 0–35)
AST: 26 U/L (ref 0–37)
Albumin: 5 g/dL (ref 3.5–5.2)
Alkaline Phosphatase: 51 U/L (ref 39–117)
Bilirubin, Direct: 0.1 mg/dL (ref 0.0–0.3)
Total Bilirubin: 0.5 mg/dL (ref 0.2–1.2)
Total Protein: 7.8 g/dL (ref 6.0–8.3)

## 2023-07-15 LAB — BASIC METABOLIC PANEL WITH GFR
BUN: 14 mg/dL (ref 6–23)
CO2: 26 meq/L (ref 19–32)
Calcium: 9.7 mg/dL (ref 8.4–10.5)
Chloride: 99 meq/L (ref 96–112)
Creatinine, Ser: 0.83 mg/dL (ref 0.40–1.20)
GFR: 94.41 mL/min (ref >60.00)
Glucose, Bld: 104 mg/dL — ABNORMAL HIGH (ref 70–99)
Potassium: 4.3 meq/L (ref 3.5–5.1)
Sodium: 136 meq/L (ref 135–145)

## 2023-07-15 LAB — URINALYSIS, ROUTINE W REFLEX MICROSCOPIC
Bilirubin Urine: NEGATIVE
Hgb urine dipstick: NEGATIVE
Ketones, ur: NEGATIVE
Leukocytes,Ua: NEGATIVE
Nitrite: NEGATIVE
Specific Gravity, Urine: 1.02 (ref 1.000–1.030)
Total Protein, Urine: NEGATIVE
Urine Glucose: NEGATIVE
Urobilinogen, UA: 0.2 (ref 0.0–1.0)
pH: 6 (ref 5.0–8.0)

## 2023-07-15 LAB — TSH: TSH: 2.56 u[IU]/mL (ref 0.35–5.50)

## 2023-07-15 MED ORDER — NEBIVOLOL HCL 10 MG PO TABS: 10.0000 mg | ORAL_TABLET | Freq: Every day | ORAL | 0 refills | Status: DC

## 2023-07-15 MED ORDER — INDAPAMIDE 1.25 MG PO TABS: 1.2500 mg | ORAL_TABLET | Freq: Every day | ORAL | 0 refills | Status: DC

## 2023-07-15 MED ORDER — OLMESARTAN MEDOXOMIL 20 MG PO TABS: 20.0000 mg | ORAL_TABLET | Freq: Every day | ORAL | 0 refills | Status: DC

## 2023-07-15 NOTE — Patient Instructions (Signed)
 Hypertension, Adult High blood pressure (hypertension) is when the force of blood pumping through the arteries is too strong. The arteries are the blood vessels that carry blood from the heart throughout the body. Hypertension forces the heart to work harder to pump blood and may cause arteries to become narrow or stiff. Untreated or uncontrolled hypertension can lead to a heart attack, heart failure, a stroke, kidney disease, and other problems. A blood pressure reading consists of a higher number over a lower number. Ideally, your blood pressure should be below 120/80. The first ("top") number is called the systolic pressure. It is a measure of the pressure in your arteries as your heart beats. The second ("bottom") number is called the diastolic pressure. It is a measure of the pressure in your arteries as the heart relaxes. What are the causes? The exact cause of this condition is not known. There are some conditions that result in high blood pressure. What increases the risk? Certain factors may make you more likely to develop high blood pressure. Some of these risk factors are under your control, including: Smoking. Not getting enough exercise or physical activity. Being overweight. Having too much fat, sugar, calories, or salt (sodium) in your diet. Drinking too much alcohol. Other risk factors include: Having a personal history of heart disease, diabetes, high cholesterol, or kidney disease. Stress. Having a family history of high blood pressure and high cholesterol. Having obstructive sleep apnea. Age. The risk increases with age. What are the signs or symptoms? High blood pressure may not cause symptoms. Very high blood pressure (hypertensive crisis) may cause: Headache. Fast or irregular heartbeats (palpitations). Shortness of breath. Nosebleed. Nausea and vomiting. Vision changes. Severe chest pain, dizziness, and seizures. How is this diagnosed? This condition is diagnosed by  measuring your blood pressure while you are seated, with your arm resting on a flat surface, your legs uncrossed, and your feet flat on the floor. The cuff of the blood pressure monitor will be placed directly against the skin of your upper arm at the level of your heart. Blood pressure should be measured at least twice using the same arm. Certain conditions can cause a difference in blood pressure between your right and left arms. If you have a high blood pressure reading during one visit or you have normal blood pressure with other risk factors, you may be asked to: Return on a different day to have your blood pressure checked again. Monitor your blood pressure at home for 1 week or longer. If you are diagnosed with hypertension, you may have other blood or imaging tests to help your health care provider understand your overall risk for other conditions. How is this treated? This condition is treated by making healthy lifestyle changes, such as eating healthy foods, exercising more, and reducing your alcohol intake. You may be referred for counseling on a healthy diet and physical activity. Your health care provider may prescribe medicine if lifestyle changes are not enough to get your blood pressure under control and if: Your systolic blood pressure is above 130. Your diastolic blood pressure is above 80. Your personal target blood pressure may vary depending on your medical conditions, your age, and other factors. Follow these instructions at home: Eating and drinking  Eat a diet that is high in fiber and potassium, and low in sodium, added sugar, and fat. An example of this eating plan is called the DASH diet. DASH stands for Dietary Approaches to Stop Hypertension. To eat this way: Eat  plenty of fresh fruits and vegetables. Try to fill one half of your plate at each meal with fruits and vegetables. Eat whole grains, such as whole-wheat pasta, brown rice, or whole-grain bread. Fill about one  fourth of your plate with whole grains. Eat or drink low-fat dairy products, such as skim milk or low-fat yogurt. Avoid fatty cuts of meat, processed or cured meats, and poultry with skin. Fill about one fourth of your plate with lean proteins, such as fish, chicken without skin, beans, eggs, or tofu. Avoid pre-made and processed foods. These tend to be higher in sodium, added sugar, and fat. Reduce your daily sodium intake. Many people with hypertension should eat less than 1,500 mg of sodium a day. Do not drink alcohol if: Your health care provider tells you not to drink. You are pregnant, may be pregnant, or are planning to become pregnant. If you drink alcohol: Limit how much you have to: 0-1 drink a day for women. 0-2 drinks a day for men. Know how much alcohol is in your drink. In the [place] one drink equals one 12 oz bottle of beer (355 mL), one 5 oz glass of wine (148 mL), or one 1 oz glass of hard liquor (44 mL). Lifestyle  Work with your health care provider to maintain a healthy body weight or to lose weight. Ask what an ideal weight is for you. Get at least 30 minutes of exercise that causes your heart to beat faster (aerobic exercise) most days of the week. Activities may include walking, swimming, or biking. Include exercise to strengthen your muscles (resistance exercise), such as Pilates or lifting weights, as part of your weekly exercise routine. Try to do these types of exercises for 30 minutes at least 3 days a week. Do not use any products that contain nicotine or tobacco. These products include cigarettes, chewing tobacco, and vaping devices, such as e-cigarettes. If you need help quitting, ask your health care provider. Monitor your blood pressure at home as told by your health care provider. Keep all follow-up visits. This is important. Medicines Take over-the-counter and prescription medicines only as told by your health care provider. Follow directions carefully. Blood  pressure medicines must be taken as prescribed. Do not skip doses of blood pressure medicine. Doing this puts you at risk for problems and can make the medicine less effective. Ask your health care provider about side effects or reactions to medicines that you should watch for. Contact a health care provider if you: Think you are having a reaction to a medicine you are taking. Have headaches that keep coming back (recurring). Feel dizzy. Have swelling in your ankles. Have trouble with your vision. Get help right away if you: Develop a severe headache or confusion. Have unusual weakness or numbness. Feel faint. Have severe pain in your chest or abdomen. Vomit repeatedly. Have trouble breathing. These symptoms may be an emergency. Get help right away. Call 911. Do not wait to see if the symptoms will go away. Do not drive yourself to the hospital. Summary Hypertension is when the force of blood pumping through your arteries is too strong. If this condition is not controlled, it may put you at risk for serious complications. Your personal target blood pressure may vary depending on your medical conditions, your age, and other factors. For most people, a normal blood pressure is less than 120/80. Hypertension is treated with lifestyle changes, medicines, or a combination of both. Lifestyle changes include losing weight, eating a healthy,  low-sodium diet, exercising more, and limiting alcohol. This information is not intended to replace advice given to you by your health care provider. Make sure you discuss any questions you have with your health care provider. Document Revised: 01/30/2021 Document Reviewed: 01/30/2021 Elsevier Patient Education  2024 [employer].

## 2023-07-15 NOTE — Progress Notes (Unsigned)
 Subjective:  Patient ID: Anita Foster, female    DOB: Oct 07, 1990  Age: 33 y.o. MRN: [id number]  CC: Hypertension and Hypothyroidism   HPI Ambert M Chernick presents for f/up ---  Discussed the use of AI scribe software for clinical note transcription with the patient, who gave verbal consent to proceed.  History of Present Illness   Anita Foster is a 33 year old female with hypertension who presents for a follow-up visit.  She is currently on two antihypertensive medications, though the specific names and dosages are not mentioned. Her blood pressure during the visit was 140/100 mmHg. No symptoms of headache, blurred vision, chest pain, or shortness of breath. She also reports no issues with energy, constipation, diarrhea, dizziness, or lightheadedness.  Regarding her thyroid condition, she is on a dose of 100 mcg, which was increased in February. She feels better since the dosage adjustment and denies any adverse effects from the current dose.  Her last menstrual cycle was on March 29th, and she is currently taking Micronor. She experienced spotting instead of a full menstrual period in February, but this has since regulated itself. No uncontrollable vaginal bleeding.       Outpatient Medications Prior to Visit  Medication Sig Dispense Refill   levothyroxine (SYNTHROID) 100 MCG tablet Take 1 tablet (100 mcg total) by mouth daily. 90 tablet 3   norethindrone (MICRONOR) 0.35 MG tablet Take 1 tablet (0.35 mg total) by mouth daily. 84 tablet 3   potassium chloride SA (KLOR-CON M15) 15 MEQ tablet Take 1 tablet (15 mEq total) by mouth 2 (two) times daily. 180 tablet 1   indapamide (LOZOL) 1.25 MG tablet TAKE 1 TABLET BY MOUTH DAILY. 90 tablet 0   metroNIDAZOLE (FLAGYL) 500 MG tablet Take 1 tablet (500 mg total) by mouth 2 (two) times daily. Take twice daily for one week. 14 tablet 0   nebivolol (BYSTOLIC) 10 MG tablet TAKE 1 TABLET BY MOUTH EVERY DAY 90 tablet 0   ondansetron (ZOFRAN) 4  MG tablet Take 1 tablet (4 mg total) by mouth every 8 (eight) hours as needed for nausea or vomiting. 20 tablet 0   No facility-administered medications prior to visit.    ROS Review of Systems  Objective:  BP (!) 140/100 (BP Location: Left Arm, Patient Position: Sitting, Cuff Size: Large) Comment: BP (R) 138/92  Pulse 76   Temp 98.6 F (37 C) (Oral)   Resp 16   Ht 5' 0.35" (1.533 m)   Wt 225 lb (102.1 kg)   LMP 07/05/2023 (Exact Date)   SpO2 96%   BMI 43.43 kg/m   BP Readings from Last 3 Encounters:  07/15/23 (!) 140/100  05/20/23 (!) 140/90  04/29/23 128/72    Wt Readings from Last 3 Encounters:  07/15/23 225 lb (102.1 kg)  05/20/23 227 lb 9.6 oz (103.2 kg)  04/29/23 226 lb (102.5 kg)    Physical Exam Vitals reviewed.  Constitutional:      Appearance: Normal appearance.  HENT:     Nose: Nose normal.     Mouth/Throat:     Mouth: Mucous membranes are moist.  Eyes:     General: No scleral icterus.    Conjunctiva/sclera: Conjunctivae normal.  Cardiovascular:     Rate and Rhythm: Normal rate and regular rhythm.     Heart sounds: No murmur heard.    No friction rub. No gallop.     Comments: EKG- NSR with SA, 70 bpm No LVH, Q waves, or ST/T  wave changes  Pulmonary:     Effort: Pulmonary effort is normal.     Breath sounds: No stridor. No wheezing, rhonchi or rales.  Abdominal:     General: Abdomen is flat.     Palpations: There is no mass.     Tenderness: There is no abdominal tenderness. There is no guarding.     Hernia: No hernia is present.  Musculoskeletal:        General: Normal range of motion.     Cervical back: Neck supple.     Right lower leg: No edema.     Left lower leg: No edema.  Skin:    General: Skin is warm and dry.  Neurological:     General: No focal deficit present.     Mental Status: She is alert.  Psychiatric:        Mood and Affect: Mood normal.        Behavior: Behavior normal.     Lab Results  Component Value Date   WBC  8.5 07/15/2023   HGB 15.3 (H) 07/15/2023   HCT 43.6 07/15/2023   PLT 270.0 07/15/2023   GLUCOSE 104 (H) 07/15/2023   CHOL 143 08/21/2022   TRIG 213.0 (H) 08/21/2022   HDL 30.50 (L) 08/21/2022   LDLDIRECT 60.0 08/21/2022   LDLCALC 68 03/20/2020   ALT 39 (H) 07/15/2023   AST 26 07/15/2023   NA 136 07/15/2023   K 4.3 07/15/2023   CL 99 07/15/2023   CREATININE 0.83 07/15/2023   BUN 14 07/15/2023   CO2 26 07/15/2023   TSH 2.56 07/15/2023   HGBA1C 5.2 04/29/2023    No results found.  Assessment & Plan:  Acquired hypothyroidism -     CBC with Differential/Platelet; Future -     TSH; Future -     Hepatic function panel; Future  Malignant hypertension -     CBC with Differential/Platelet; Future -     Basic metabolic panel with GFR; Future -     TSH; Future -     Hepatic function panel; Future -     EKG 12-Lead -     Urinalysis, Routine w reflex microscopic; Future -     Aldosterone + renin activity w/ ratio; Future -     Indapamide; Take 1 tablet (1.25 mg total) by mouth daily.  Dispense: 90 tablet; Refill: 0 -     Nebivolol HCl; Take 1 tablet (10 mg total) by mouth daily.  Dispense: 90 tablet; Refill: 0 -     Olmesartan Medoxomil; Take 1 tablet (20 mg total) by mouth daily.  Dispense: 90 tablet; Refill: 0  Tachycardia -     Nebivolol HCl; Take 1 tablet (10 mg total) by mouth daily.  Dispense: 90 tablet; Refill: 0     Follow-up: Return in about 3 months (around 10/14/2023).  Sanda Linger, MD

## 2023-07-16 ENCOUNTER — Encounter: Payer: Self-pay | Admitting: Internal Medicine

## 2023-07-19 LAB — ALDOSTERONE + RENIN ACTIVITY W/ RATIO
ALDO / PRA Ratio: 9.9 ratio (ref 0.9–28.9)
Aldosterone: 8 ng/dL
Renin Activity: 0.81 ng/mL/h (ref 0.25–5.82)

## 2023-10-10 ENCOUNTER — Other Ambulatory Visit: Payer: Self-pay | Admitting: Internal Medicine

## 2023-10-10 DIAGNOSIS — I10 Essential (primary) hypertension: Secondary | ICD-10-CM

## 2023-12-15 DIAGNOSIS — H60333 Swimmer's ear, bilateral: Secondary | ICD-10-CM | POA: Insufficient documentation

## 2023-12-15 DIAGNOSIS — H6992 Unspecified Eustachian tube disorder, left ear: Secondary | ICD-10-CM | POA: Insufficient documentation

## 2024-01-30 ENCOUNTER — Other Ambulatory Visit: Payer: Self-pay | Admitting: Internal Medicine

## 2024-01-30 DIAGNOSIS — R000 Tachycardia, unspecified: Secondary | ICD-10-CM

## 2024-01-30 DIAGNOSIS — I10 Essential (primary) hypertension: Secondary | ICD-10-CM

## 2024-03-03 ENCOUNTER — Ambulatory Visit: Admitting: Internal Medicine

## 2024-03-03 ENCOUNTER — Encounter: Payer: Self-pay | Admitting: Internal Medicine

## 2024-03-03 VITALS — BP 134/88 | HR 88 | Temp 98.8°F | Resp 16 | Ht 60.35 in | Wt 221.4 lb

## 2024-03-03 DIAGNOSIS — I10 Essential (primary) hypertension: Secondary | ICD-10-CM

## 2024-03-03 DIAGNOSIS — E876 Hypokalemia: Secondary | ICD-10-CM

## 2024-03-03 DIAGNOSIS — R7989 Other specified abnormal findings of blood chemistry: Secondary | ICD-10-CM

## 2024-03-03 DIAGNOSIS — E039 Hypothyroidism, unspecified: Secondary | ICD-10-CM | POA: Diagnosis not present

## 2024-03-03 DIAGNOSIS — L249 Irritant contact dermatitis, unspecified cause: Secondary | ICD-10-CM | POA: Insufficient documentation

## 2024-03-03 DIAGNOSIS — I493 Ventricular premature depolarization: Secondary | ICD-10-CM | POA: Diagnosis not present

## 2024-03-03 DIAGNOSIS — T502X5A Adverse effect of carbonic-anhydrase inhibitors, benzothiadiazides and other diuretics, initial encounter: Secondary | ICD-10-CM

## 2024-03-03 LAB — CBC WITH DIFFERENTIAL/PLATELET
Basophils Absolute: 0 K/uL (ref 0.0–0.1)
Basophils Relative: 0.2 % (ref 0.0–3.0)
Eosinophils Absolute: 0.1 K/uL (ref 0.0–0.7)
Eosinophils Relative: 1.2 % (ref 0.0–5.0)
HCT: 40.5 % (ref 36.0–46.0)
Hemoglobin: 14.2 g/dL (ref 12.0–15.0)
Lymphocytes Relative: 20.7 % (ref 12.0–46.0)
Lymphs Abs: 2 K/uL (ref 0.7–4.0)
MCHC: 34.9 g/dL (ref 30.0–36.0)
MCV: 92 fl (ref 78.0–100.0)
Monocytes Absolute: 0.6 K/uL (ref 0.1–1.0)
Monocytes Relative: 6 % (ref 3.0–12.0)
Neutro Abs: 7 K/uL (ref 1.4–7.7)
Neutrophils Relative %: 71.9 % (ref 43.0–77.0)
Platelets: 226 K/uL (ref 150.0–400.0)
RBC: 4.41 Mil/uL (ref 3.87–5.11)
RDW: 12.8 % (ref 11.5–15.5)
WBC: 9.7 K/uL (ref 4.0–10.5)

## 2024-03-03 LAB — BASIC METABOLIC PANEL WITH GFR
BUN: 17 mg/dL (ref 6–23)
CO2: 28 meq/L (ref 19–32)
Calcium: 9.7 mg/dL (ref 8.4–10.5)
Chloride: 98 meq/L (ref 96–112)
Creatinine, Ser: 0.76 mg/dL (ref 0.40–1.20)
GFR: 104.47 mL/min (ref >60.00)
Glucose, Bld: 85 mg/dL (ref 70–99)
Potassium: 3.9 meq/L (ref 3.5–5.1)
Sodium: 135 meq/L (ref 135–145)

## 2024-03-03 LAB — BRAIN NATRIURETIC PEPTIDE: Pro B Natriuretic peptide (BNP): 16 pg/mL (ref 0.0–100.0)

## 2024-03-03 LAB — HEPATIC FUNCTION PANEL
ALT: 47 U/L — ABNORMAL HIGH (ref 0–35)
AST: 33 U/L (ref 0–37)
Albumin: 4.8 g/dL (ref 3.5–5.2)
Alkaline Phosphatase: 44 U/L (ref 39–117)
Bilirubin, Direct: 0.1 mg/dL (ref 0.0–0.3)
Total Bilirubin: 0.6 mg/dL (ref 0.2–1.2)
Total Protein: 7.8 g/dL (ref 6.0–8.3)

## 2024-03-03 LAB — TSH: TSH: 3.54 u[IU]/mL (ref 0.35–5.50)

## 2024-03-03 LAB — TROPONIN I (HIGH SENSITIVITY): High Sens Troponin I: 3 ng/L (ref 2–17)

## 2024-03-03 LAB — MAGNESIUM: Magnesium: 1.7 mg/dL (ref 1.5–2.5)

## 2024-03-03 LAB — PROTIME-INR
INR: 1.1 ratio — ABNORMAL HIGH (ref 0.8–1.0)
Prothrombin Time: 11.4 s (ref 9.6–13.1)

## 2024-03-03 MED ORDER — POTASSIUM CHLORIDE CRYS ER 15 MEQ PO TBCR: 15.0000 meq | EXTENDED_RELEASE_TABLET | Freq: Two times a day (BID) | ORAL | 1 refills | Status: AC

## 2024-03-03 MED ORDER — MAGNESIUM OXIDE 250 MG PO TABS: 1.0000 | ORAL_TABLET | Freq: Every day | ORAL | 1 refills | Status: AC

## 2024-03-03 NOTE — Patient Instructions (Signed)
 Premature Ventricular Contraction  A premature ventricular contraction (PVC) is a type of irregular heartbeat (arrhythmia). The heart has four chambers, including the upper chambers (atria) and lower chambers (ventricles). Normally, an electrical signal starts in a group of cells called the sinoatrial node (SA node) and travels through the atria, causing them to pump blood into the ventricles. During a PVC, the heartbeat starts in one of the lower ventricles. This may cause the heartbeat to be shorter and less effective. In most cases, PVCs come and go and do not require treatment. What are the causes? Common causes of the condition include: Heart attack or coronary artery disease (CAD). Heart valve problems. Heart surgery. Infection of the heart (myocarditis). Inflammation of the heart. In many cases, the cause of this condition is not known. What increases the risk? The following factors may make you more likely to develop this condition: Age, especially being over age 19. Being female. An imbalance of salts and minerals in the body (electrolytes). Low blood oxygen levels or high carbon dioxide levels. Certain medicines, including over-the-counter and prescribed medicines. High blood pressure. Obesity. Episodes may be triggered by: Vigorous exercise. Tobacco, alcohol, or caffeine use. Illegal drug use. Emotional stress. Poor or irregular sleep. What are the signs or symptoms? The main symptoms of this condition are fast or irregular heartbeats (palpitations) or the feeling of a pause in the heartbeat. Other symptoms include: Shortness of breath. Difficulty exercising. Chest pain. Feeling tired. Dizziness. In some cases, there are no symptoms. How is this diagnosed? This condition may be diagnosed based on: Your medical history or symptoms. A physical exam. Your health care provider may listen to your heart. Tests, such as: Blood tests. An ECG (electrocardiogram) to monitor the  electrical activity of your heart. An ambulatory cardiac monitor that records your heartbeats for 24 hours or more. Stress tests to see how exercise affects your heart rhythm and blood supply. An echocardiogram, which creates an image of your heart. An electrophysiology study (EPS) to check for electrical problems in your heart. How is this treated? Treatment for this condition depends on any underlying conditions, the type of PVC, how many PVCs you have had, and if the symptoms are affecting your daily life. Possible treatments include: Avoiding things that cause PVCs (triggers). These include caffeine, tobacco, and alcohol. Taking medicines if symptoms are severe or if the arrhythmias happen a lot. Getting treatment for underlying conditions that cause PVCs. Having an implantable cardioverter defibrillator (ICD) placed in the chest to monitor the heartbeat. The monitor sends a shock to the heart if it senses an arrhythmia and brings the heartbeat back to normal. Having a catheter ablation procedure to destroy the part of the heart tissue that sends abnormal signals. In many cases, no treatment is required. Follow these instructions at home: Lifestyle  Do not use any products that contain nicotine or tobacco. These products include cigarettes, chewing tobacco, and vaping devices, such as e-cigarettes. If you need help quitting, ask your health care provider. Do not use illegal drugs. Exercise regularly. Ask your health care provider what type of exercise is safe for you. Try to get at least 7-9 hours of sleep each night. Find healthy ways to manage stress. Avoid stressful situations when possible. Alcohol use Do not drink alcohol if: Your health care provider tells you not to drink. You are pregnant, may be pregnant, or are planning to become pregnant. Alcohol triggers your episodes. If you drink alcohol: Limit how much you have to: 0-1  drink a day for women. 0-2 drinks a day for  men. Know how much alcohol is in your drink. In the [place] one drink equals one 12 oz bottle of beer (355 mL), one 5 oz glass of wine (148 mL), or one 1 oz glass of hard liquor (44 mL). General instructions Take over-the-counter and prescription medicines only as told by your health care provider. If caffeine triggers episodes of PVC, do not eat, drink, or use anything with caffeine in it. Contact a health care provider if: You feel palpitations often. You have nausea and vomiting. Get help right away if: You have chest pain. You have trouble breathing. You start sweating for no reason. You become light-headed or you faint. These symptoms may be an emergency. Get help right away. Call 911. Do not wait to see if the symptoms will go away. Do not drive yourself to the hospital. This information is not intended to replace advice given to you by your health care provider. Make sure you discuss any questions you have with your health care provider. Document Revised: 08/24/2021 Document Reviewed: 08/24/2021 Elsevier Patient Education  2024 [employer].

## 2024-03-03 NOTE — Progress Notes (Signed)
 Subjective:  Patient ID: Anita Foster, female    DOB: 1991-02-25  Age: 33 y.o. MRN: [id number]  CC: Hypertension and Hypothyroidism   HPI Anita Foster presents for f/up ----  Discussed the use of AI scribe software for clinical note transcription with the patient, who gave verbal consent to proceed.  History of Present Illness Anita Foster is a 33 year old female with hypertension who presents for evaluation of blood pressure management.  Her blood pressure has decreased significantly since her last visit in April, where it was recorded at 140/100 mmHg. She has not made any significant lifestyle changes that she believes would contribute to this change, although she has been walking more on a treadmill until two weeks ago.  No symptoms of weakness, dizziness, or lightheadedness. No headaches, blurred vision, chest pain, shortness of breath, dizziness, or lightheadedness during physical activity. Her last menstrual cycle was on November 13th, and she is currently taking Micronor .  Her current medications include indapamide , omisartan, and a thyroid  supplement. She is no longer taking potassium. No fatigue or constipation, and she feels that her thyroid  medication dose is appropriate.  She does not receive flu or COVID vaccines, and her tetanus vaccination was updated two years ago. 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  No history of an irregular heartbeat. She reports good sleep and no abdominal pain or swelling.     Outpatient Medications Prior to Visit  Medication Sig Dispense Refill   nebivolol  (BYSTOLIC ) 10 MG tablet TAKE 1 TABLET BY MOUTH EVERY DAY 90 tablet 0   norethindrone  (MICRONOR ) 0.35 MG tablet Take 1 tablet (0.35 mg total) by mouth daily. 84 tablet 3   indapamide  (LOZOL ) 1.25 MG tablet TAKE 1 TABLET BY MOUTH EVERY DAY 90 tablet 0   levothyroxine  (SYNTHROID ) 100 MCG tablet  Take 1 tablet (100 mcg total) by mouth daily. 90 tablet 3   olmesartan  (BENICAR ) 20 MG tablet TAKE 1 TABLET BY MOUTH EVERY DAY 90 tablet 0   potassium chloride  SA (KLOR-CON  M15) 15 MEQ tablet Take 1 tablet (15 mEq total) by mouth 2 (two) times daily. 180 tablet 1   No facility-administered medications prior to visit.    ROS Review of Systems  Constitutional:  Negative for appetite change, chills, diaphoresis, fatigue and fever.  HENT: Negative.    Eyes: Negative.   Respiratory:  Negative for cough, chest tightness, shortness of breath and wheezing.   Cardiovascular:  Negative for chest pain, palpitations and leg swelling.  Gastrointestinal:  Negative for abdominal pain, constipation, diarrhea, nausea and vomiting.  Endocrine: Negative.   Genitourinary:  Negative for difficulty urinating and dysuria.  Musculoskeletal: Negative.  Negative for back pain and myalgias.  Skin: Negative.   Neurological:  Negative for dizziness and weakness.  Hematological:  Negative for adenopathy. Does not bruise/bleed easily.  Psychiatric/Behavioral: Negative.      Objective:  BP 134/88 (BP Location: Left Arm, Patient Position: Sitting, Cuff Size: Normal)   Pulse 88   Temp 98.8 F (37.1 C) (Oral)   Resp 16   Ht 5' 0.35 (1.533 m)   Wt 221 lb 6.4 oz (100.4 kg)   LMP 02/19/2024 (Exact Date)   SpO2 97%   BMI 42.74 kg/m   BP Readings from Last 3 Encounters:  03/03/24 134/88  07/15/23 (!) 140/100  05/20/23 (!) 140/90    Wt Readings from Last 3 Encounters:  03/03/24  221 lb 6.4 oz (100.4 kg)  07/15/23 225 lb (102.1 kg)  05/20/23 227 lb 9.6 oz (103.2 kg)    Physical Exam Vitals reviewed.  HENT:     Nose: Nose normal.     Mouth/Throat:     Mouth: Mucous membranes are moist.  Eyes:     General: No scleral icterus.    Conjunctiva/sclera: Conjunctivae normal.  Cardiovascular:     Rate and Rhythm: Normal rate and regular rhythm. Occasional Extrasystoles are present.    Heart sounds: Normal  heart sounds, S1 normal and S2 normal. No murmur heard.    No gallop.     Comments: EKG  ----  SR with SA and PVC's (new), 88 bpm No LVH, Q waves, or ST/T wave changes  Pulmonary:     Effort: Pulmonary effort is normal.     Breath sounds: No stridor. No wheezing, rhonchi or rales.  Abdominal:     General: Abdomen is flat.     Palpations: There is no mass.     Tenderness: There is no abdominal tenderness. There is no guarding.     Hernia: No hernia is present.  Musculoskeletal:        General: No swelling. Normal range of motion.     Cervical back: Neck supple.     Right lower leg: No edema.     Left lower leg: No edema.  Lymphadenopathy:     Cervical: No cervical adenopathy.  Skin:    General: Skin is warm.  Neurological:     General: No focal deficit present.     Mental Status: She is alert.  Psychiatric:        Mood and Affect: Mood normal.        Behavior: Behavior normal.     Lab Results  Component Value Date   WBC 9.7 03/03/2024   HGB 14.2 03/03/2024   HCT 40.5 03/03/2024   PLT 226.0 03/03/2024   GLUCOSE 85 03/03/2024   CHOL 143 08/21/2022   TRIG 213.0 (H) 08/21/2022   HDL 30.50 (L) 08/21/2022   LDLDIRECT 60.0 08/21/2022   LDLCALC 68 03/20/2020   ALT 47 (H) 03/03/2024   AST 33 03/03/2024   NA 135 03/03/2024   K 3.9 03/03/2024   CL 98 03/03/2024   CREATININE 0.76 03/03/2024   BUN 17 03/03/2024   CO2 28 03/03/2024   TSH 3.54 03/03/2024   INR 1.1 (H) 03/03/2024   HGBA1C 5.2 04/29/2023   Fibrosis 4 Score = .66  Fib-4 interpretation is not validated for people under 35 or over 17 years of age. However, scores under 2.0 are generally considered low risk.   Assessment & Plan:   Acquired hypothyroidism- She is euthyroid. -     TSH; Future -     CBC with Differential/Platelet; Future -     Levothyroxine  Sodium; Take 1 tablet (100 mcg total) by mouth daily.  Dispense: 90 tablet; Refill: 1  Elevated LFTs -     Protime-INR; Future -     Hepatitis B  surface antibody,quantitative; Future -     Hepatitis B surface antigen; Future -     Hepatitis A antibody, total; Future -     Hepatitis B core antibody, total; Future -     Hepatic function panel; Future  Malignant hypertension- BP is well controlled. -     Basic metabolic panel with GFR; Future -     CBC with Differential/Platelet; Future -     EKG 12-Lead -  Potassium Chloride  Crys ER; Take 1 tablet (15 mEq total) by mouth 2 (two) times daily.  Dispense: 180 tablet; Refill: 1 -     Indapamide ; Take 1 tablet (1.25 mg total) by mouth daily.  Dispense: 90 tablet; Refill: 1 -     Olmesartan  Medoxomil; Take 1 tablet (20 mg total) by mouth daily.  Dispense: 90 tablet; Refill: 1  Unifocal PVCs -     Magnesium ; Future -     Troponin I (High Sensitivity); Future -     Brain natriuretic peptide; Future -     D-dimer, quantitative; Future -     ECHOCARDIOGRAM COMPLETE; Future -     Magnesium  Oxide; Take 1 tablet (250 mg total) by mouth daily.  Dispense: 90 tablet; Refill: 1  Diuretic-induced hypokalemia -     Potassium Chloride  Crys ER; Take 1 tablet (15 mEq total) by mouth 2 (two) times daily.  Dispense: 180 tablet; Refill: 1 -     Magnesium  Oxide; Take 1 tablet (250 mg total) by mouth daily.  Dispense: 90 tablet; Refill: 1  D-dimer, elevated- Will evaluate for PE. -     CT Angio Chest Pulmonary Embolism (PE) W or WO Contrast; Future     Follow-up: Return in about 6 months (around 08/31/2024).  Debby Molt, MD

## 2024-03-05 ENCOUNTER — Ambulatory Visit: Payer: Self-pay | Admitting: Internal Medicine

## 2024-03-05 DIAGNOSIS — R7989 Other specified abnormal findings of blood chemistry: Secondary | ICD-10-CM | POA: Insufficient documentation

## 2024-03-05 DIAGNOSIS — E876 Hypokalemia: Secondary | ICD-10-CM | POA: Insufficient documentation

## 2024-03-05 LAB — D-DIMER, QUANTITATIVE: D-Dimer, Quant: 1.79 ug{FEU}/mL — ABNORMAL HIGH (ref <0.50)

## 2024-03-05 LAB — HEPATITIS B SURFACE ANTIBODY, QUANTITATIVE: Hep B S AB Quant (Post): <5 m[IU]/mL — ABNORMAL LOW (ref >=10)

## 2024-03-05 LAB — HEPATITIS B SURFACE ANTIGEN: Hepatitis B Surface Ag: NONREACTIVE

## 2024-03-05 LAB — HEPATITIS B CORE ANTIBODY, TOTAL: Hep B Core Total Ab: NONREACTIVE

## 2024-03-05 LAB — HEPATITIS A ANTIBODY, TOTAL: Hepatitis A AB,Total: REACTIVE — AB

## 2024-03-05 MED ORDER — INDAPAMIDE 1.25 MG PO TABS: 1.2500 mg | ORAL_TABLET | Freq: Every day | ORAL | 1 refills | Status: AC

## 2024-03-05 MED ORDER — OLMESARTAN MEDOXOMIL 20 MG PO TABS: 20.0000 mg | ORAL_TABLET | Freq: Every day | ORAL | 1 refills | Status: AC

## 2024-03-05 MED ORDER — LEVOTHYROXINE SODIUM 100 MCG PO TABS: 100.0000 ug | ORAL_TABLET | Freq: Every day | ORAL | 1 refills | Status: DC

## 2024-03-08 ENCOUNTER — Telehealth: Payer: Self-pay

## 2024-03-08 NOTE — Telephone Encounter (Signed)
 Copied from CRM #[id number]. Topic: General - Other >> Mar 08, 2024  8:53 AM Thersia BROCKS wrote: Reason for CRM: bonnie D[facility] called in regarding authorization for patients appointment for her scan would like a callback  [phone]

## 2024-03-08 NOTE — Telephone Encounter (Signed)
 Copied from CRM #[id number]. Topic: Clinical - Request for Lab/Test Order >> Mar 08, 2024  1:44 PM Alexandria E wrote: Reason for CRM: Patient is needing an order entered for Hepatitis B vaccination. Please call patient once entered.

## 2024-03-09 ENCOUNTER — Other Ambulatory Visit

## 2024-03-09 NOTE — Telephone Encounter (Signed)
Spoke with patient, nurse visit scheduled.  

## 2024-03-09 NOTE — Telephone Encounter (Signed)
 Message sent to referral coordinator for authorization.

## 2024-03-10 ENCOUNTER — Inpatient Hospital Stay: Admission: RE | Admit: 2024-03-10 | Discharge: 2024-03-10 | Attending: Internal Medicine | Admitting: Internal Medicine

## 2024-03-10 ENCOUNTER — Ambulatory Visit (HOSPITAL_COMMUNITY): Admission: RE | Admit: 2024-03-10 | Discharge: 2024-03-10 | Attending: Internal Medicine | Admitting: Internal Medicine

## 2024-03-10 ENCOUNTER — Ambulatory Visit

## 2024-03-10 DIAGNOSIS — I493 Ventricular premature depolarization: Secondary | ICD-10-CM | POA: Insufficient documentation

## 2024-03-10 DIAGNOSIS — Z23 Encounter for immunization: Secondary | ICD-10-CM

## 2024-03-10 DIAGNOSIS — R7989 Other specified abnormal findings of blood chemistry: Secondary | ICD-10-CM

## 2024-03-10 LAB — ECHOCARDIOGRAM COMPLETE
Area-P 1/2: 3.5 cm2
S' Lateral: 2.1 cm

## 2024-03-10 MED ORDER — IOPAMIDOL (ISOVUE-370) INJECTION 76%
75.0000 mL | Freq: Once | INTRAVENOUS | Status: AC | PRN
  Administered 2024-03-10: 75 mL via INTRAVENOUS

## 2024-03-10 NOTE — Progress Notes (Signed)
 Heplisav-B vaccine given to pt w/o any complications.

## 2024-03-30 ENCOUNTER — Other Ambulatory Visit: Payer: Self-pay | Admitting: Obstetrics and Gynecology

## 2024-03-30 NOTE — Telephone Encounter (Signed)
 Med refill request:   norethindrone  (MICRONOR ) 0.35 MG tablet  Start:  04/29/23 Disp:  84 tablets Refills:  3  Last AEX:  04/29/23 Next AEX:  05/13/24 Last MMG (if hormonal med):  N/A Refill authorized? Please Advise.

## 2024-04-27 ENCOUNTER — Other Ambulatory Visit: Payer: Self-pay | Admitting: Internal Medicine

## 2024-04-27 DIAGNOSIS — I10 Essential (primary) hypertension: Secondary | ICD-10-CM

## 2024-04-27 DIAGNOSIS — R000 Tachycardia, unspecified: Secondary | ICD-10-CM

## 2024-04-30 ENCOUNTER — Other Ambulatory Visit: Payer: Self-pay | Admitting: Family Medicine

## 2024-04-30 DIAGNOSIS — E039 Hypothyroidism, unspecified: Secondary | ICD-10-CM

## 2024-05-12 NOTE — Progress Notes (Unsigned)
 "  34 y.o. G0P0000 Single Caucasian female here for annual exam.  Periods are irregular, coming late and spotting in between. Period is not consistent.  Menses occur every 28 - 32 days.  Used to be every 28 days.  Last 6 days.    No spotting in between cycles.  Can spot for a few days before her 6 day cycle. Sometimes has cramping the week before her cycles.     Forgot her pill twice, otherwise is really regular with dosing.    No partner change.  Declines STD screening.         Had a CT angiogram due to elevated D-Dimer, neg for PE.  Arrhythmia noted by PCP on exam, and normal ECHO.    PCP: Joshua Debby CROME, MD   Patient's last menstrual period was 04/20/2024 (exact date).     Period Cycle (Days): 28 Period Duration (Days): 6 Period Pattern: Regular Menstrual Flow: Moderate Menstrual Control: Maxi pad, Tampon Dysmenorrhea: (!) Mild Dysmenorrhea Symptoms: Cramping     Sexually active: Yes.   Female partner.  The current method of family planning is OCP (estrogen/progesterone).    Menopausal hormone therapy:  n/a Exercising: No.   Smoker:  no     05/13/2024    8:02 AM 03/03/2024    3:04 PM 07/15/2023    9:27 AM 04/15/2022    4:08 PM 01/01/2022    3:25 PM  Depression screen PHQ 2/9  Decreased Interest 0 0 0 0 0  Down, Depressed, Hopeless 0 0 0 0 0  PHQ - 2 Score 0 0 0 0 0  Altered sleeping  0   0  Tired, decreased energy  0   0  Change in appetite  0   0  Feeling bad or failure about yourself   0   0  Trouble concentrating  0   0  Moving slowly or fidgety/restless  0   0  Suicidal thoughts  0   0  PHQ-9 Score  0   0   Difficult doing work/chores  Not difficult at all        Data saved with a previous flowsheet row definition     OB History  Gravida Para Term Preterm AB Living  0 0 0 0 0 0  SAB IAB Ectopic Multiple Live Births  0 0 0 0 0     HEALTH MAINTENANCE: Last 2 paps:  04/29/23 neg, HR HPV neg, 01/23/21 neg History of abnormal Pap or positive HPV:   no Mammogram:   n/a Colonoscopy:  n/a Bone Density:  n/a  Result  n/a   Immunization History  Administered Date(s) Administered   Hepb-cpg 03/10/2024   Tdap 04/09/2011, 01/01/2022      reports that she has never smoked. She has never been exposed to tobacco smoke. She has never used smokeless tobacco. She reports current alcohol use. She reports that she does not use drugs.  Past Medical History:  Diagnosis Date   Hypertension    Hypertriglyceridemia 2020   Thyroid  disease    hypothyroidism    History reviewed. No pertinent surgical history.  Current Outpatient Medications  Medication Sig Dispense Refill   indapamide  (LOZOL ) 1.25 MG tablet Take 1 tablet (1.25 mg total) by mouth daily. 90 tablet 1   levothyroxine  (SYNTHROID ) 100 MCG tablet TAKE 1 TABLET BY MOUTH EVERY DAY 90 tablet 3   Magnesium  Oxide 250 MG TABS Take 1 tablet (250 mg total) by mouth daily. 90 tablet 1  nebivolol  (BYSTOLIC ) 10 MG tablet TAKE 1 TABLET BY MOUTH EVERY DAY 90 tablet 0   norethindrone  (MICRONOR ) 0.35 MG tablet TAKE 1 TABLET BY MOUTH EVERY DAY 84 tablet 0   olmesartan  (BENICAR ) 20 MG tablet Take 1 tablet (20 mg total) by mouth daily. 90 tablet 1   potassium chloride  SA (KLOR-CON  M15) 15 MEQ tablet Take 1 tablet (15 mEq total) by mouth 2 (two) times daily. 180 tablet 1   No current facility-administered medications for this visit.    ALLERGIES: Sulfa antibiotics  Family History  Problem Relation Age of Onset   Breast cancer Mother 2       A & W   Hypertension Father    Hyperlipidemia Father    Anxiety disorder Brother    Other Maternal Aunt        precancerous lesions   Diabetes Paternal Grandmother    Hypertension Paternal Grandmother    Stroke Paternal Grandmother    Hypertension Paternal Grandfather     Review of Systems  All other systems reviewed and are negative.   PHYSICAL EXAM:  BP 114/76 (BP Location: Left Arm, Patient Position: Sitting)   Pulse (!) 104   Ht 5' 5 (1.651  m)   Wt 222 lb (100.7 kg)   LMP 04/20/2024 (Exact Date)   SpO2 97%   BMI 36.94 kg/m     General appearance: alert, cooperative and appears stated age Head: normocephalic, without obvious abnormality, atraumatic Neck: no adenopathy, supple, symmetrical, trachea midline and thyroid  normal to inspection and palpation Lungs: clear to auscultation bilaterally Breasts: normal appearance, no masses or tenderness, No nipple retraction or dimpling, No nipple discharge or bleeding, No axillary adenopathy Heart: regular rate and rhythm.  Occasional PAC versus PVC. Abdomen: soft, non-tender; no masses, no organomegaly Extremities: extremities normal, atraumatic, no cyanosis or edema Skin: skin color, texture, turgor normal. No rashes or lesions Lymph nodes: cervical, supraclavicular, and axillary nodes normal. Neurologic: grossly normal  Pelvic: External genitalia:  no lesions              No abnormal inguinal nodes palpated.              Urethra:  normal appearing urethra with no masses, tenderness or lesions              Bartholins and Skenes: normal                 Vagina: normal appearing vagina with normal color and discharge, no lesions              Cervix: no lesions              Pap taken: no Bimanual Exam:  Uterus:  normal size, contour, position, consistency, mobility, non-tender              Adnexa: no mass, fullness, tenderness           Chaperone was present for exam:  Kari HERO, CMA  ASSESSMENT: Well woman visit with gynecologic exam. On POPs. HTN.  Possible hidradenitis suppurativa.  Acanthosis nigricans.  Periodic elevated glucose on chart review. Hypothyroidism.  Elevated TSH with last TFTs.   PCP managing her hypothyroidism.  Elevated ALT.   Liver steatosis.  FH premenopausal breast cancer in mother, age 52. PHQ-2-9: 0  PLAN: Mammogram screening discussed.  Mammogram at age 62. Self breast awareness reviewed. Pap and HRV collected:  no.  Due in 2030. Guidelines for  Calcium, Vitamin D, regular exercise program including cardiovascular  and weight bearing exercise. Medication refills:  Micronor  3 packs, 3 refills. Return for prolonged menstrual bleeding or frequent or absent menstruation.  Labs with PCP.  Follow up:  yearly and prn.            "

## 2024-05-13 ENCOUNTER — Ambulatory Visit (INDEPENDENT_AMBULATORY_CARE_PROVIDER_SITE_OTHER): Payer: No Typology Code available for payment source | Admitting: Obstetrics and Gynecology

## 2024-05-13 ENCOUNTER — Encounter: Payer: Self-pay | Admitting: Obstetrics and Gynecology

## 2024-05-13 VITALS — BP 114/76 | HR 104 | Ht 65.0 in | Wt 222.0 lb

## 2024-05-13 DIAGNOSIS — Z01419 Encounter for gynecological examination (general) (routine) without abnormal findings: Secondary | ICD-10-CM | POA: Diagnosis not present

## 2024-05-13 DIAGNOSIS — Z1331 Encounter for screening for depression: Secondary | ICD-10-CM

## 2024-05-13 MED ORDER — NORETHINDRONE 0.35 MG PO TABS: 1.0000 | ORAL_TABLET | Freq: Every day | ORAL | 3 refills | Status: AC

## 2024-05-13 NOTE — Patient Instructions (Signed)

## 2025-05-18 ENCOUNTER — Ambulatory Visit: Admitting: Obstetrics and Gynecology
